# Patient Record
Sex: Male | Born: 1985 | Race: Black or African American | Hispanic: No | Marital: Single | Smoking: Current every day smoker
Health system: Southern US, Community
[De-identification: ages and names within clinical notes are randomized; demographics above are authoritative.]

## PROBLEM LIST (undated history)

## (undated) DIAGNOSIS — I10 Essential (primary) hypertension: Secondary | ICD-10-CM

---

## 2004-02-02 ENCOUNTER — Emergency Department: Payer: Self-pay | Admitting: Emergency Medicine

## 2010-11-25 ENCOUNTER — Emergency Department: Payer: Self-pay | Admitting: Emergency Medicine

## 2012-01-17 ENCOUNTER — Inpatient Hospital Stay: Payer: Self-pay | Admitting: Internal Medicine

## 2012-01-17 LAB — CBC
HCT: 41.4 % (ref 40.0–52.0)
MCH: 31.3 pg (ref 26.0–34.0)
MCV: 91 fL (ref 80–100)
Platelet: 166 10*3/uL (ref 150–440)
RBC: 4.54 10*6/uL (ref 4.40–5.90)
WBC: 7.9 10*3/uL (ref 3.8–10.6)

## 2012-01-17 LAB — COMPREHENSIVE METABOLIC PANEL
Albumin: 3.4 g/dL (ref 3.4–5.0)
Alkaline Phosphatase: 99 U/L (ref 50–136)
Anion Gap: 9 (ref 7–16)
Calcium, Total: 8.6 mg/dL (ref 8.5–10.1)
Co2: 28 mmol/L (ref 21–32)
Glucose: 102 mg/dL — ABNORMAL HIGH (ref 65–99)
Osmolality: 280 (ref 275–301)
Potassium: 3.3 mmol/L — ABNORMAL LOW (ref 3.5–5.1)
SGOT(AST): 44 U/L — ABNORMAL HIGH (ref 15–37)
SGPT (ALT): 46 U/L (ref 12–78)

## 2012-01-17 LAB — LIPID PANEL
Cholesterol: 141 mg/dL (ref 0–200)
HDL Cholesterol: 34 mg/dL — ABNORMAL LOW (ref 40–60)
Triglycerides: 108 mg/dL (ref 0–200)
VLDL Cholesterol, Calc: 22 mg/dL (ref 5–40)

## 2012-01-17 LAB — TROPONIN I
Troponin-I: 2.8 ng/mL — ABNORMAL HIGH
Troponin-I: 2.44 ng/mL — ABNORMAL HIGH

## 2012-01-17 LAB — URINALYSIS, COMPLETE
Bilirubin,UR: NEGATIVE
Blood: NEGATIVE
Nitrite: NEGATIVE
Ph: 7 (ref 4.5–8.0)
Squamous Epithelial: NONE SEEN

## 2012-01-17 LAB — CK TOTAL AND CKMB (NOT AT ARMC)
CK-MB: 1.7 ng/mL (ref 0.5–3.6)
CK-MB: 8.8 ng/mL — ABNORMAL HIGH (ref 0.5–3.6)

## 2012-01-17 LAB — PROTIME-INR
INR: 1.1
Prothrombin Time: 14.6 secs (ref 11.5–14.7)

## 2012-01-17 IMAGING — CT CT ANGIO CHEST-ABD-PELV
2 series · 16 of 42 positions shown, 19 images · IV contrast (APPLIED)
Comparison: none

REASON FOR EXAM: chest and back pain with elevated troponin eval
dissection
COMMENTS:

PROCEDURE:     CT  - CT ANGIOGRAPHY CHEST/ABD/PELVIS  - [DATE]  [DATE] [DATE]  [DATE]
RESULT:     Comparison: None
TECHNIQUE: Multiple axial images obtained from the thoracic inlet to the
pubic symphysis, without p.o. contrast and with 125 ml of [MH]
intravenous contrast, according to the dissection protocol. 3-D, MIP, and
multiplanar images were reviewed on a [employer] multiplanar work station.

[Series 6: soft tissue arm down · axial · 0.76mm/px · z∈[-503,+79]mm · 13 of 215 slices shown, 16 images]
[im 14/215  soft-tissue]
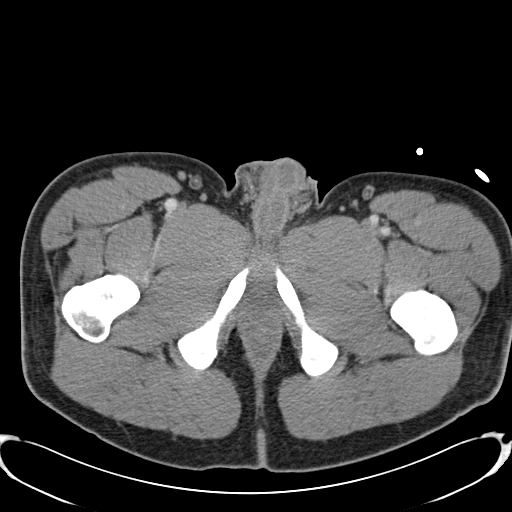
[im 14/215  bone]
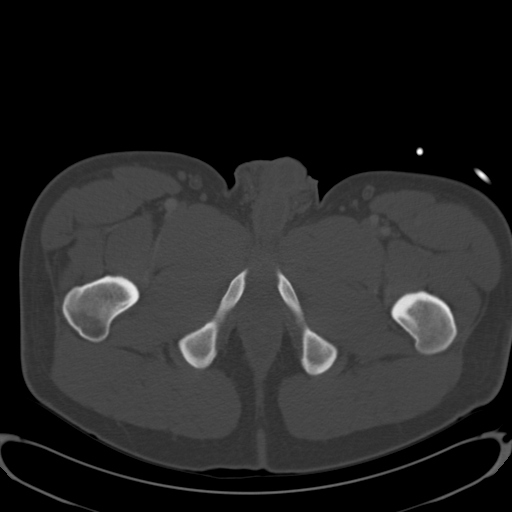
[im 35/215  soft-tissue]
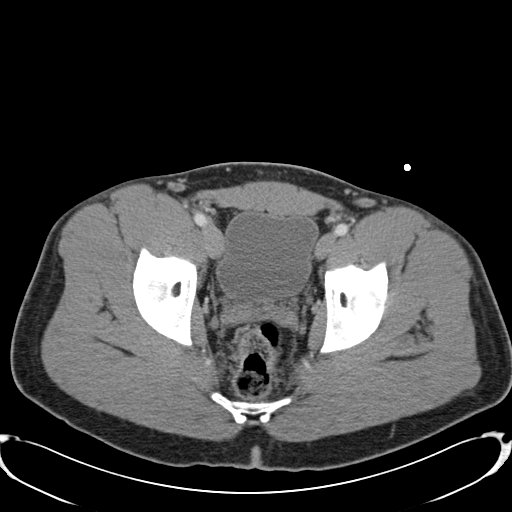
[im 56/215  soft-tissue]
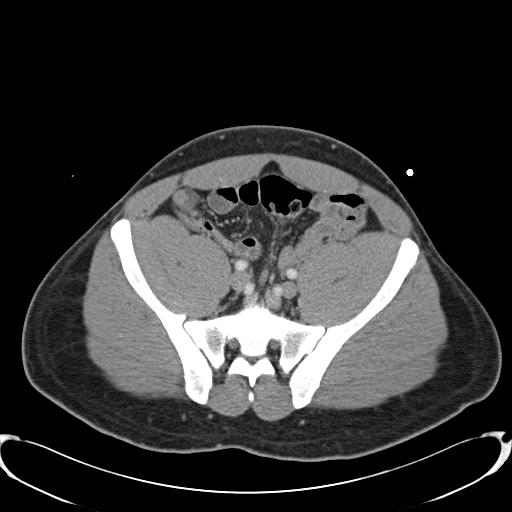
[im 76/215  soft-tissue]
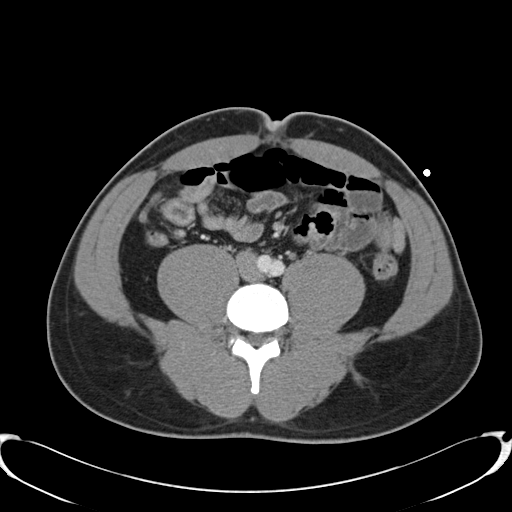
[im 97/215  soft-tissue]
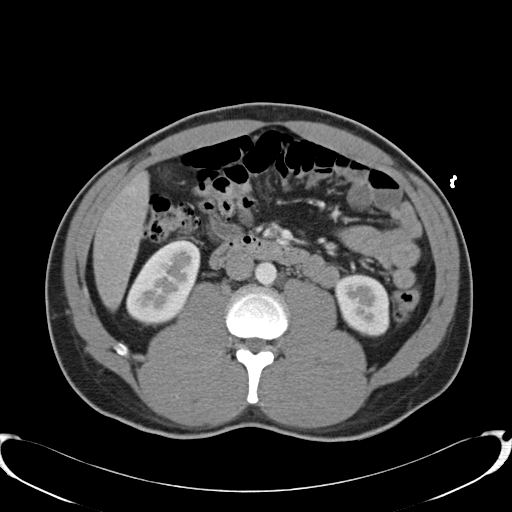
[im 118/215  soft-tissue]
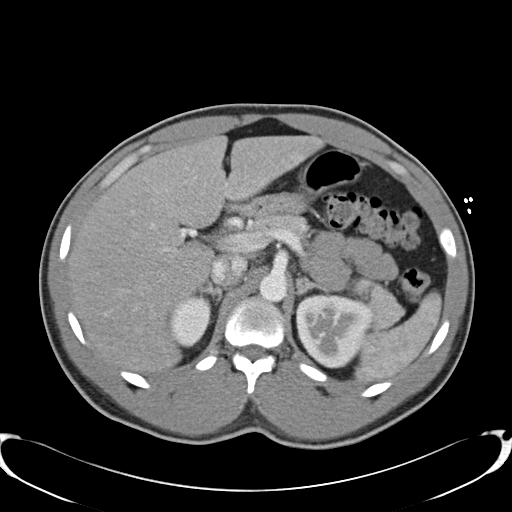
[im 139/215  soft-tissue]
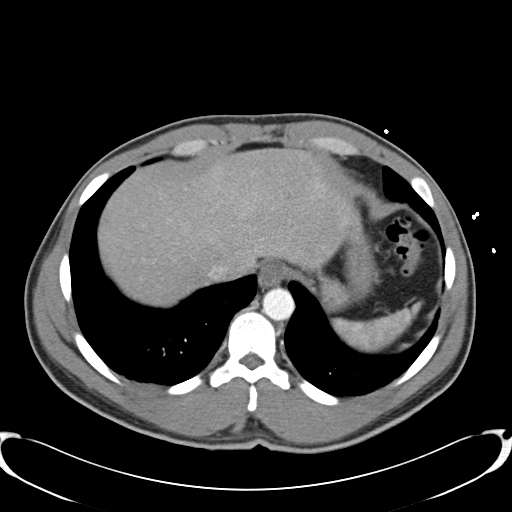
[im 159/215  soft-tissue]
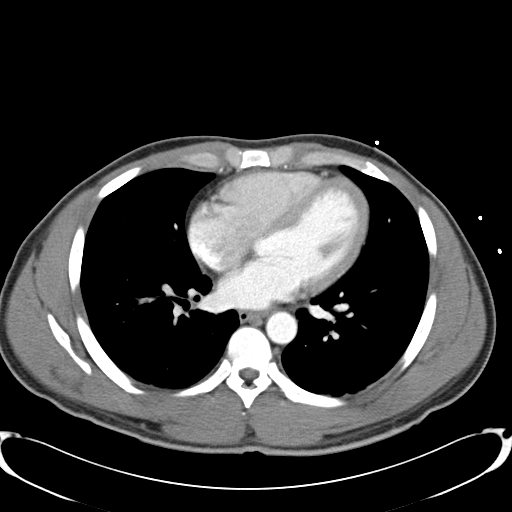
[im 180/215  soft-tissue]
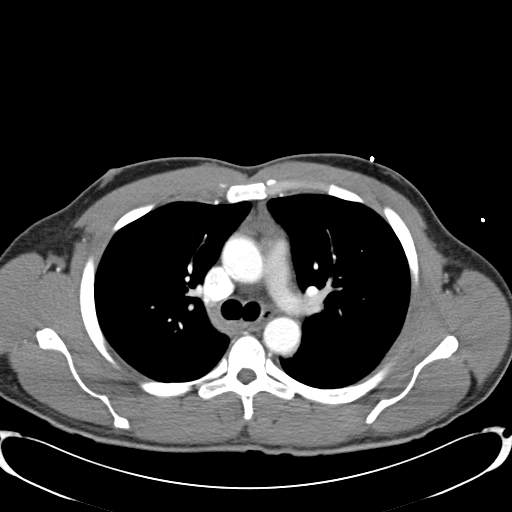
[im 180/215  bone]
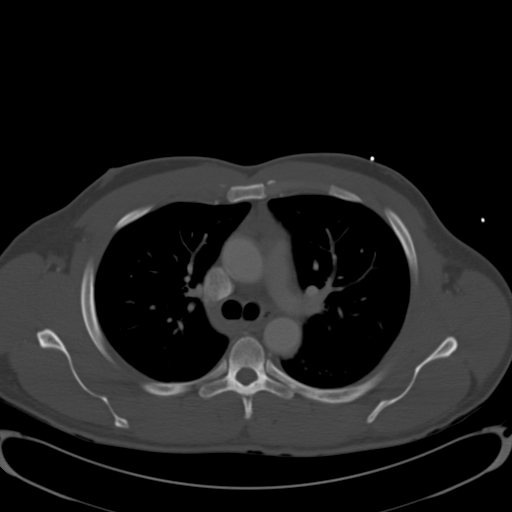
[im 187/215  lung]
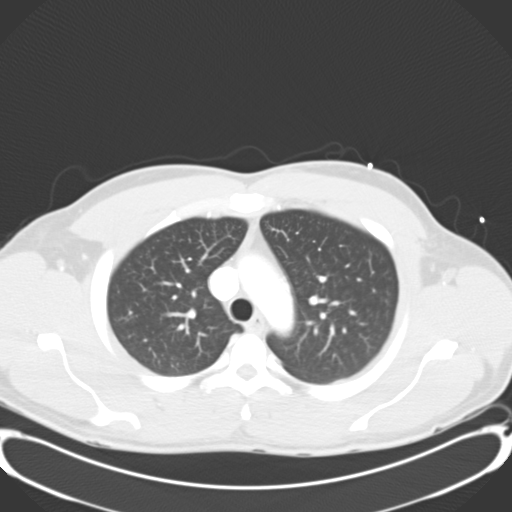
[im 194/215  lung]
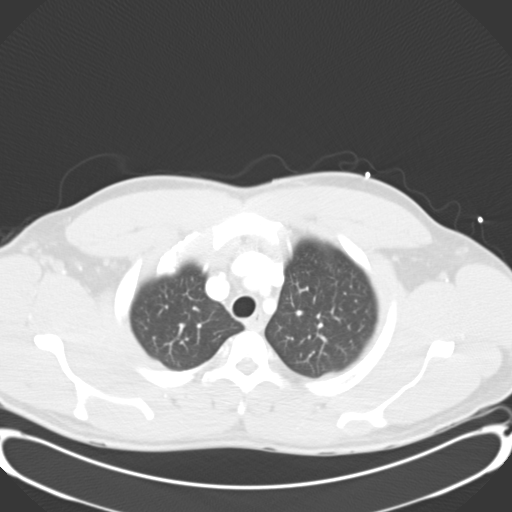
[im 201/215  soft-tissue]
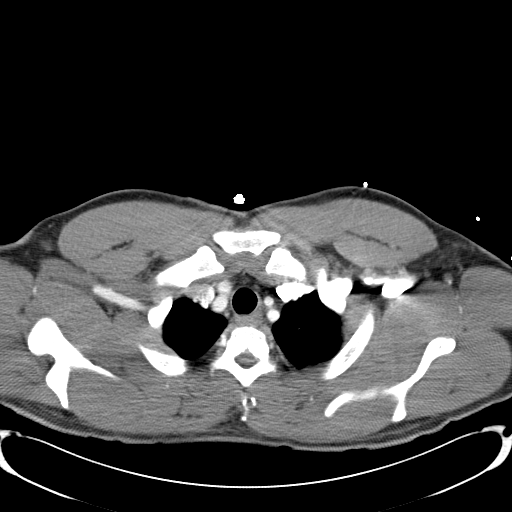
[im 201/215  lung]
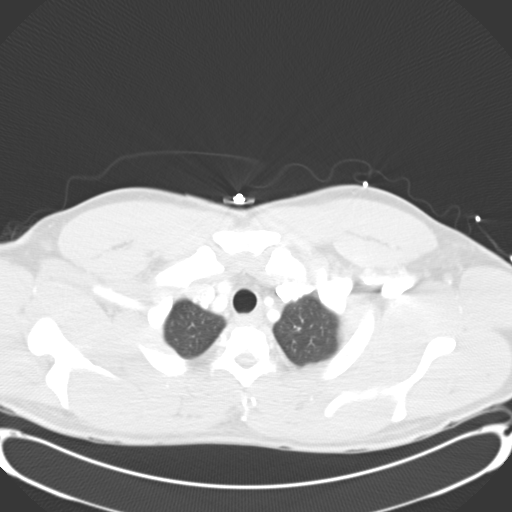
[im 208/215  lung]
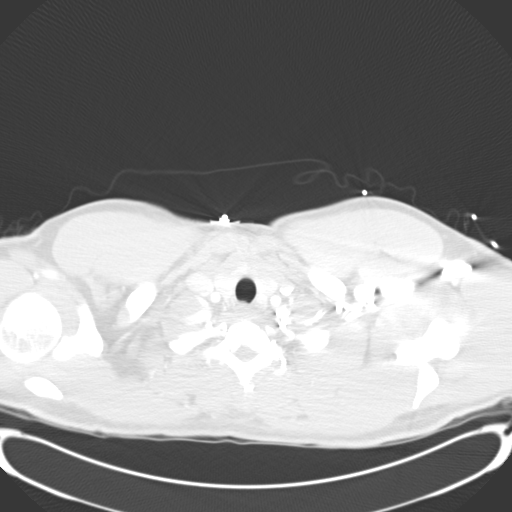

[Series 602: coronal · coronal · 1.26mm/px · 3 of 98 slices shown]
[im 33/98  soft-tissue]
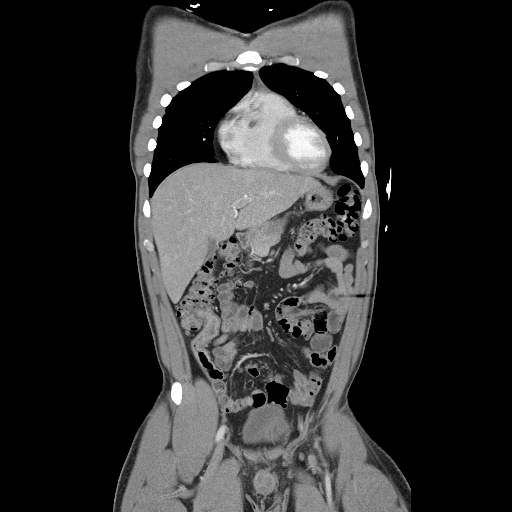
[im 44/98  soft-tissue]
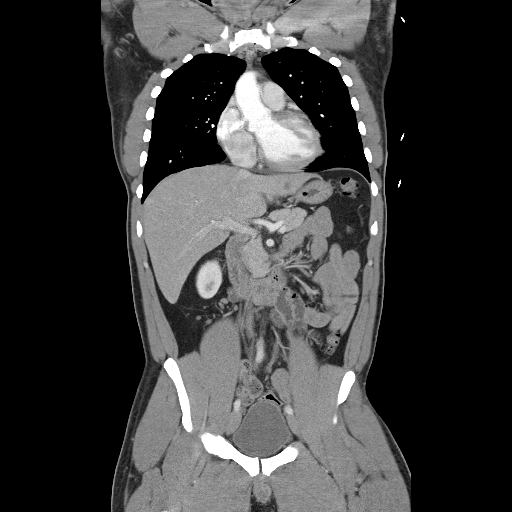
[im 54/98  soft-tissue]
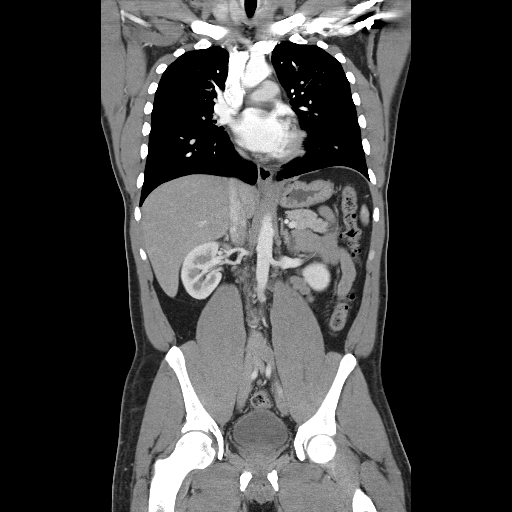

[16 of 42 positions shown; findings below may reference images not displayed]

FINDINGS: Minimal soft tissue density in the anterior mediastinum likely represents
which is several thymus. Evaluation of the aortic root and ascending aorta
is slightly limited by cardiac pulsation artifact. The aorta is normal in
caliber. No aortic dissection seen.

No mediastinal, hilar, or axillary lymphadenopathy. No pneumothorax. Minimal
basilar opacities are likely secondary to atelectasis. There is a 3 mm
nodule in the superior right lower lobe, which is likely calcified.

There are a few small, subcentimeter foci of hyperenhancement within the
liver which likely represent small perfusion anomalies. Evaluation of the
solid organs is limited by the relative arterial phase of contrast. The
spleen, adrenals, pancreas, and gallbladder are unremarkable. The kidneys
are unremarkable.

The small and large bowel are normal in caliber. There are a few diverticula
in the descending colon. The appendix is normal.

No aggressive lytic or sclerotic osseous lesions are identified.
IMPRESSION: No aortic dissection seen. Evaluation of the ascending aorta and aortic root
is slightly limited by cardiac pulsation artifact.

## 2012-01-17 IMAGING — CR DG CHEST 2V
1 series · 2 of 2 positions shown · non-contrast
Comparison: none

REASON FOR EXAM: chest pain
COMMENTS:   LMP: (Male)

[Series 1: w chest pa · 0.14mm/px · 2 of 2 slices shown]
[im 1/2]
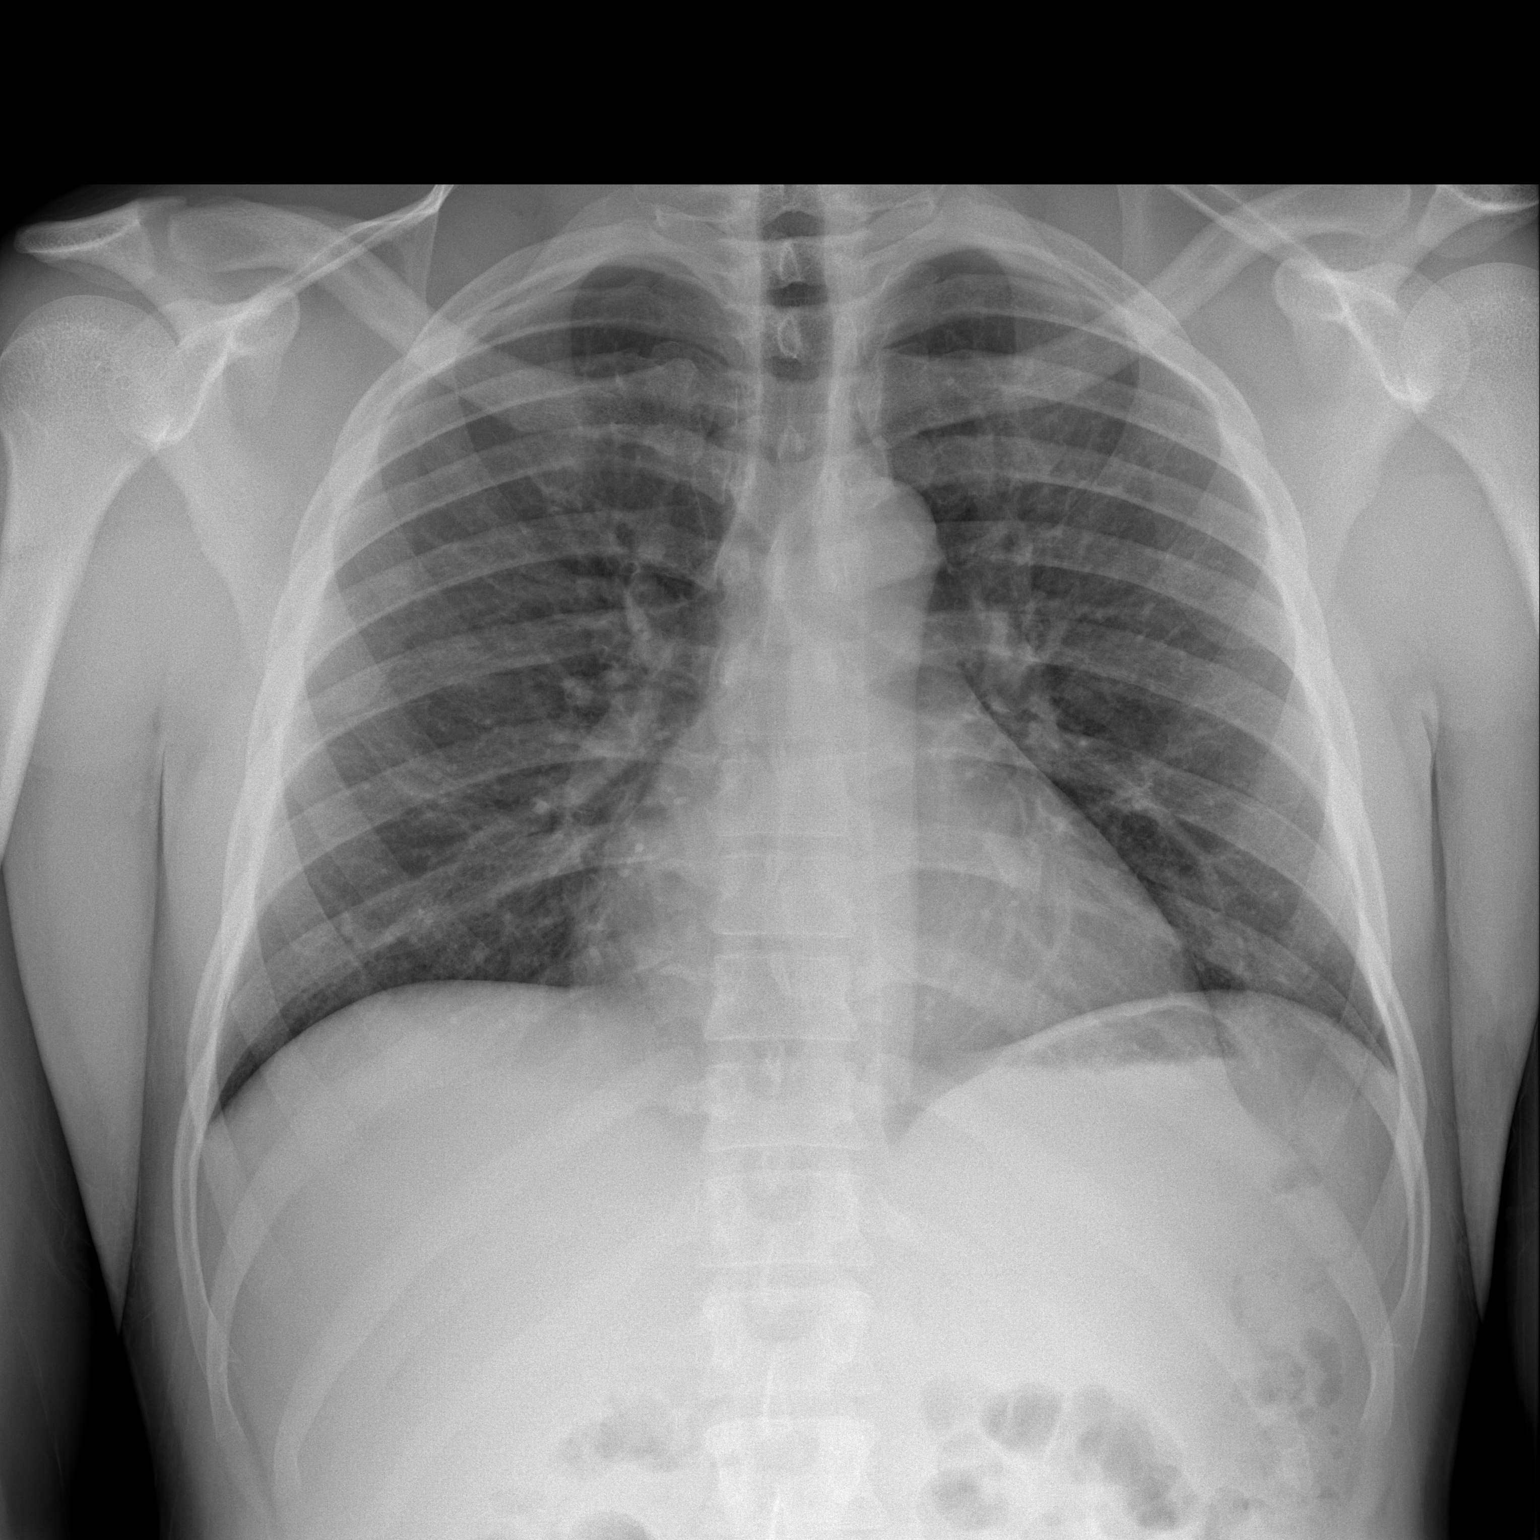
[im 2/2]
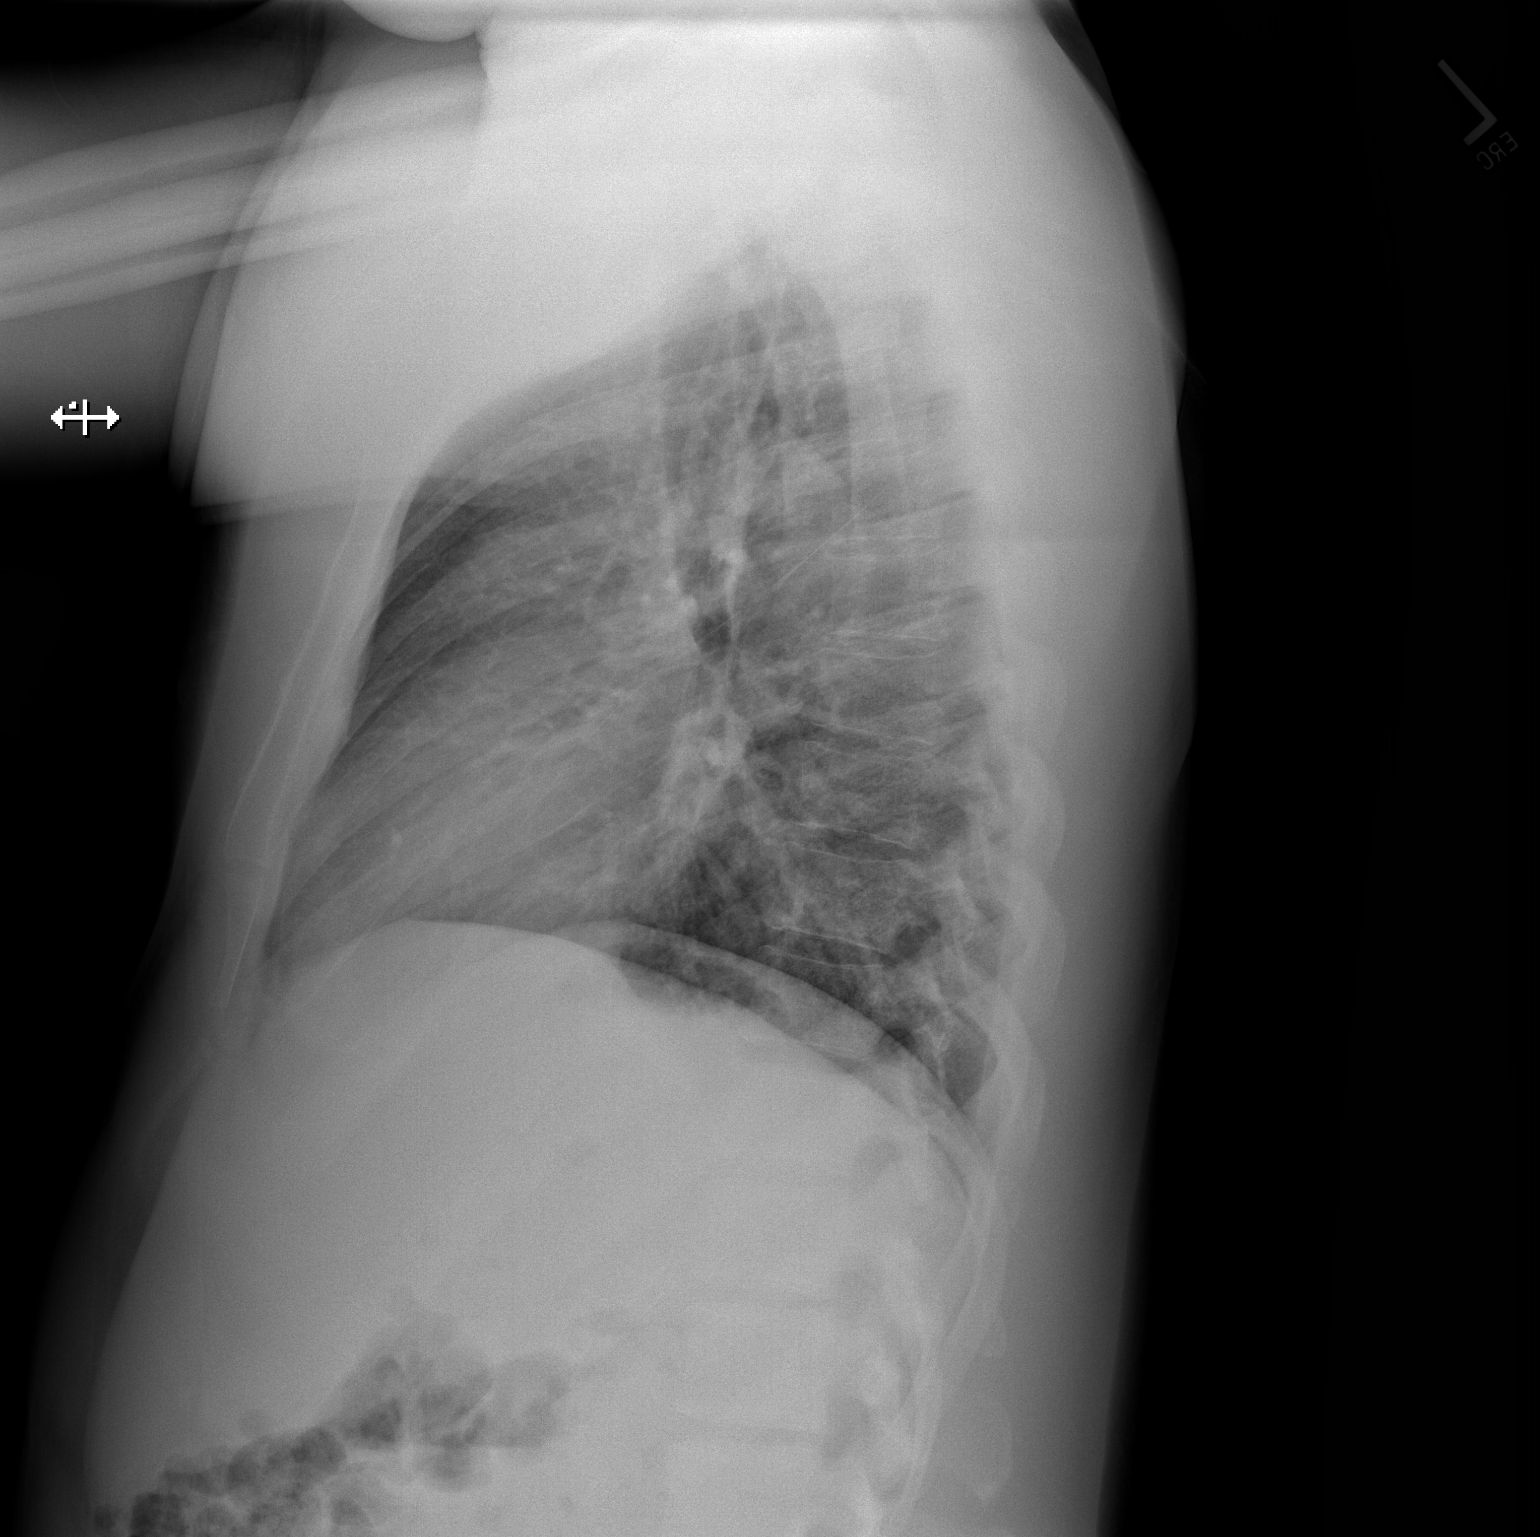

[2 of 2 positions shown; findings below may reference images not displayed]

PROCEDURE:     DXR - DXR CHEST PA (OR AP) AND LATERAL  - [DATE] [DATE]

RESULT:     The lungs are adequately inflated and exhibit mild prominence of
the interstitial markings which are nonspecific. There is no focal
infiltrate. The cardiac silhouette is enlarged. The pulmonary vascularity is
not engorged. There is no pleural effusion. The mediastinum is normal in
width. There is no evidence of a pleural effusion.
IMPRESSION: There is mild prominence of the cardiac silhouette without
evidence of pulmonary vascular congestion or pleural effusions. The
interstitial markings are also mildly prominent. Is there a smoking history?
There is no focal pneumonia, but I cannot exclude acute bronchitis with
bilateral subsegmental atelectasis in the appropriate clinical setting.

[REDACTED]

## 2012-01-18 LAB — CBC WITH DIFFERENTIAL/PLATELET
Basophil #: 0 10*3/uL (ref 0.0–0.1)
Basophil %: 0.2 %
Eosinophil #: 0.1 10*3/uL (ref 0.0–0.7)
Eosinophil %: 1.3 %
HGB: 13.8 g/dL (ref 13.0–18.0)
Lymphocyte #: 1.4 10*3/uL (ref 1.0–3.6)
MCH: 32.3 pg (ref 26.0–34.0)
MCHC: 35.2 g/dL (ref 32.0–36.0)
MCV: 92 fL (ref 80–100)
Monocyte %: 18.1 %
Neutrophil %: 61.3 %
Platelet: 172 10*3/uL (ref 150–440)
RBC: 4.26 10*6/uL — ABNORMAL LOW (ref 4.40–5.90)
WBC: 7.1 10*3/uL (ref 3.8–10.6)

## 2012-01-18 LAB — COMPREHENSIVE METABOLIC PANEL
Alkaline Phosphatase: 87 U/L (ref 50–136)
Anion Gap: 11 (ref 7–16)
BUN: 6 mg/dL — ABNORMAL LOW (ref 7–18)
Bilirubin,Total: 0.4 mg/dL (ref 0.2–1.0)
Calcium, Total: 8.4 mg/dL — ABNORMAL LOW (ref 8.5–10.1)
Chloride: 106 mmol/L (ref 98–107)
Creatinine: 0.98 mg/dL (ref 0.60–1.30)
EGFR (African American): >60
EGFR (Non-African Amer.): >60
Glucose: 83 mg/dL (ref 65–99)
Osmolality: 282 (ref 275–301)
Potassium: 3.7 mmol/L (ref 3.5–5.1)
Sodium: 143 mmol/L (ref 136–145)

## 2012-01-18 LAB — MAGNESIUM: Magnesium: 1.9 mg/dL

## 2013-10-22 ENCOUNTER — Emergency Department: Payer: Self-pay | Admitting: Emergency Medicine

## 2013-10-22 LAB — COMPREHENSIVE METABOLIC PANEL
ALBUMIN: 3.1 g/dL — AB (ref 3.4–5.0)
ALT: 22 U/L (ref 12–78)
ANION GAP: 8 (ref 7–16)
Alkaline Phosphatase: 94 U/L
BUN: 8 mg/dL (ref 7–18)
Bilirubin,Total: 0.2 mg/dL (ref 0.2–1.0)
CO2: 28 mmol/L (ref 21–32)
Calcium, Total: 8.7 mg/dL (ref 8.5–10.1)
Chloride: 104 mmol/L (ref 98–107)
Creatinine: 0.97 mg/dL (ref 0.60–1.30)
EGFR (African American): >60
Glucose: 93 mg/dL (ref 65–99)
Osmolality: 277 (ref 275–301)
Potassium: 4 mmol/L (ref 3.5–5.1)
SGOT(AST): 15 U/L (ref 15–37)
Sodium: 140 mmol/L (ref 136–145)
TOTAL PROTEIN: 7.2 g/dL (ref 6.4–8.2)

## 2013-10-22 LAB — CBC WITH DIFFERENTIAL/PLATELET
BASOS PCT: 0.3 %
Basophil #: 0 10*3/uL (ref 0.0–0.1)
Eosinophil #: 0.2 10*3/uL (ref 0.0–0.7)
Eosinophil %: 2.2 %
HCT: 38.6 % — AB (ref 40.0–52.0)
HGB: 12.6 g/dL — AB (ref 13.0–18.0)
Lymphocyte #: 1.5 10*3/uL (ref 1.0–3.6)
Lymphocyte %: 17.2 %
MCH: 29.4 pg (ref 26.0–34.0)
MCHC: 32.6 g/dL (ref 32.0–36.0)
MCV: 90 fL (ref 80–100)
MONO ABS: 1.1 x10 3/mm — AB (ref 0.2–1.0)
MONOS PCT: 12.9 %
NEUTROS PCT: 67.4 %
Neutrophil #: 5.9 10*3/uL (ref 1.4–6.5)
Platelet: 249 10*3/uL (ref 150–440)
RBC: 4.28 10*6/uL — ABNORMAL LOW (ref 4.40–5.90)
RDW: 14.2 % (ref 11.5–14.5)
WBC: 8.8 10*3/uL (ref 3.8–10.6)

## 2013-10-22 LAB — URINALYSIS, COMPLETE
BACTERIA: NONE SEEN
Bilirubin,UR: NEGATIVE
Blood: NEGATIVE
GLUCOSE, UR: NEGATIVE mg/dL (ref 0–75)
KETONE: NEGATIVE
Leukocyte Esterase: NEGATIVE
Nitrite: NEGATIVE
PROTEIN: NEGATIVE
Ph: 5 (ref 4.5–8.0)
SPECIFIC GRAVITY: 1.023 (ref 1.003–1.030)
Squamous Epithelial: <1
WBC UR: 2 /HPF (ref 0–5)

## 2013-10-22 LAB — LIPASE, BLOOD: LIPASE: 179 U/L (ref 73–393)

## 2013-10-22 IMAGING — CT CT ABD-PELV W/ CM
2 of 4 series · 16 of 46 positions shown, 18 images · IV contrast (agent unspecified)
Comparison: [DATE]

CLINICAL DATA: Intermittent abdominal pain for 1 month.
Constipation. 25 pound weight loss.

EXAM:
CT ABDOMEN AND PELVIS WITH CONTRAST
TECHNIQUE: Multidetector CT imaging of the abdomen and pelvis was performed
using the standard protocol following bolus administration of
intravenous contrast.
CONTRAST:  100 mL [N8]

[Series 2: routine abd pel with · axial · 0.72mm/px · z∈[-1058,-628]mm · 13 of 94 slices shown, 15 images]
[im 4/94  soft-tissue]
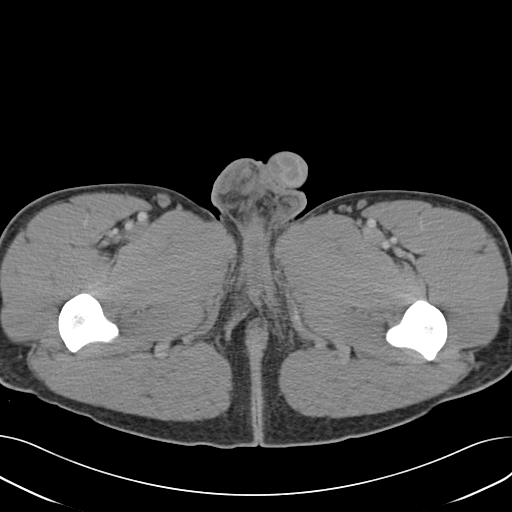
[im 4/94  bone]
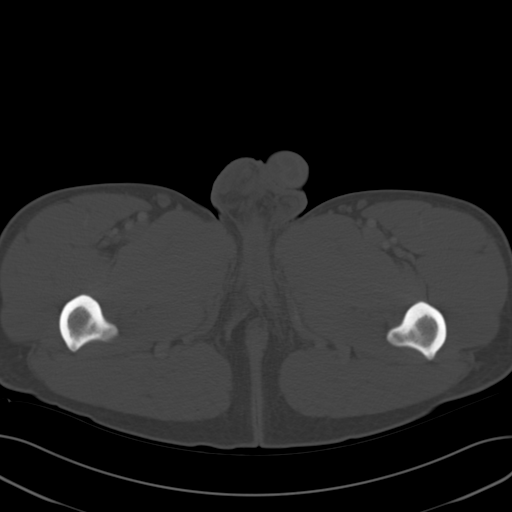
[im 12/94  soft-tissue]
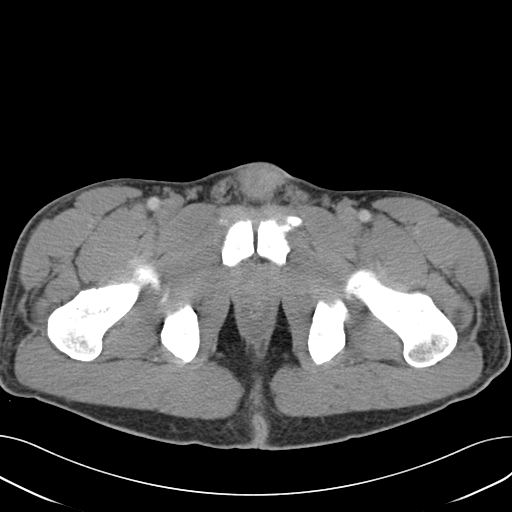
[im 20/94  soft-tissue]
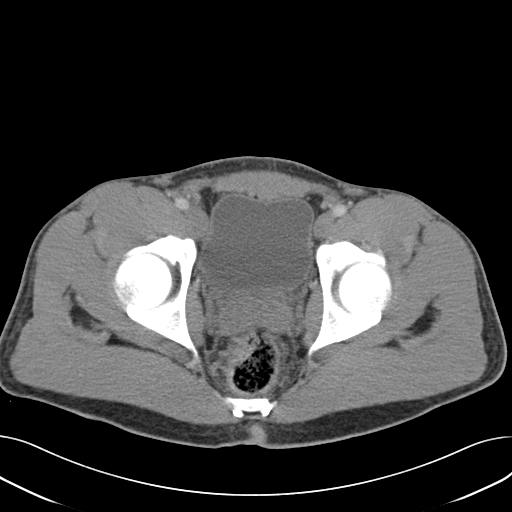
[im 28/94  soft-tissue]
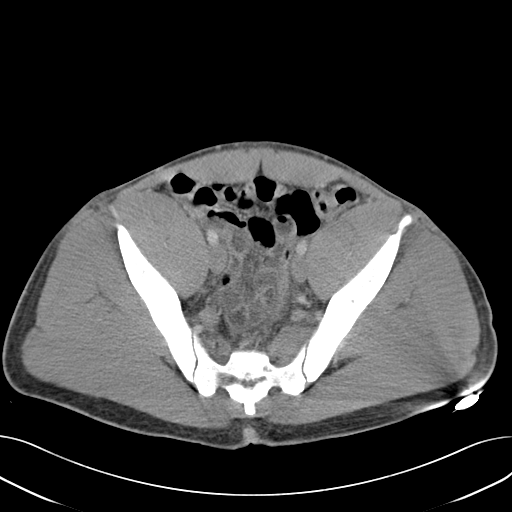
[im 32/94  soft-tissue]
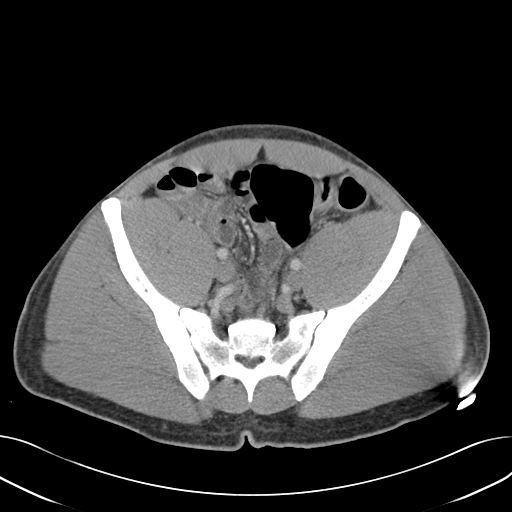
[im 39/94  soft-tissue]
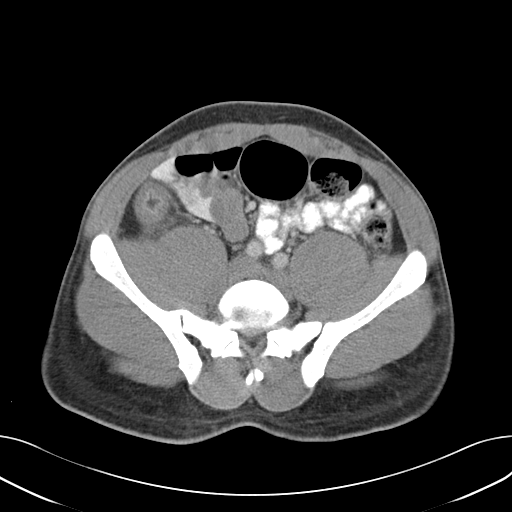
[im 47/94  soft-tissue]
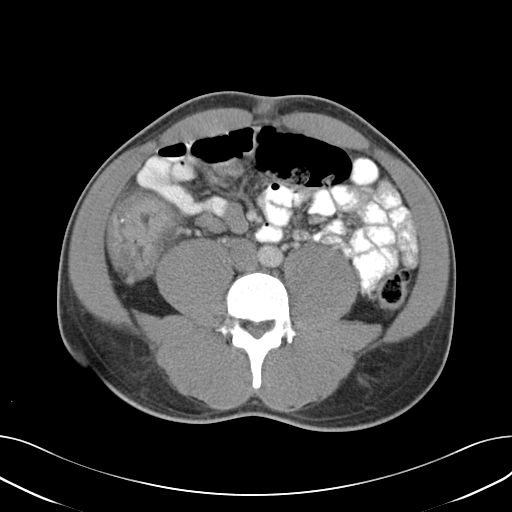
[im 55/94  soft-tissue]
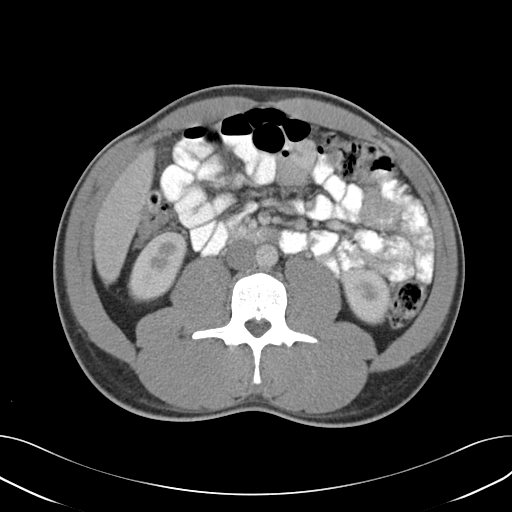
[im 63/94  soft-tissue]
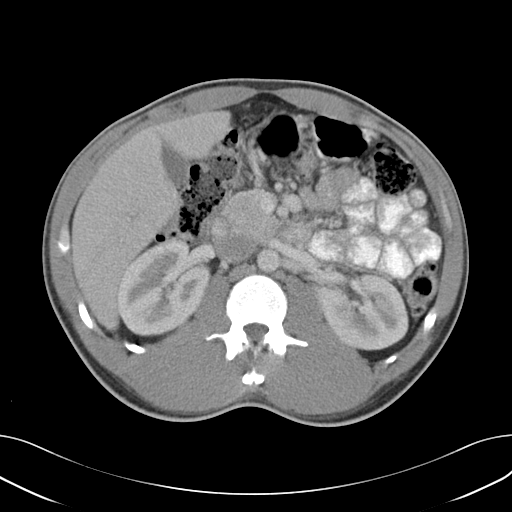
[im 63/94  bone]
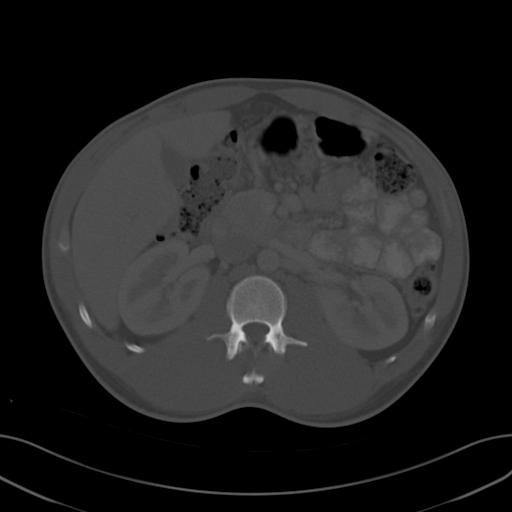
[im 66/94  soft-tissue]
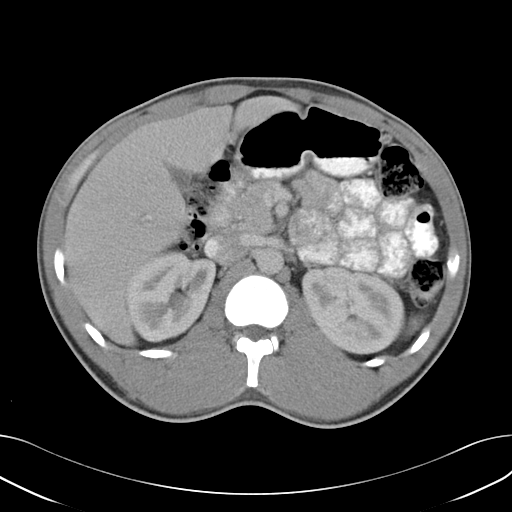
[im 74/94  soft-tissue]
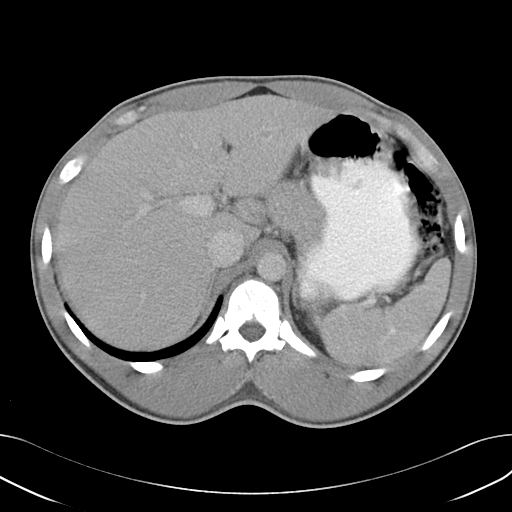
[im 82/94  soft-tissue]
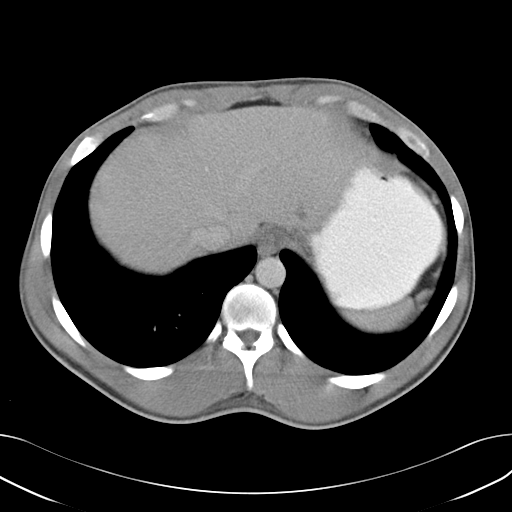
[im 90/94  soft-tissue]
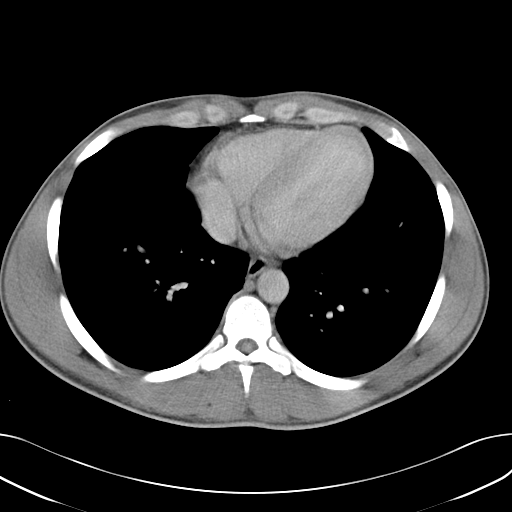

[Series 5: cor routine abd pel with · coronal · 0.69mm/px · 3 of 125 slices shown]
[im 42/125  soft-tissue]
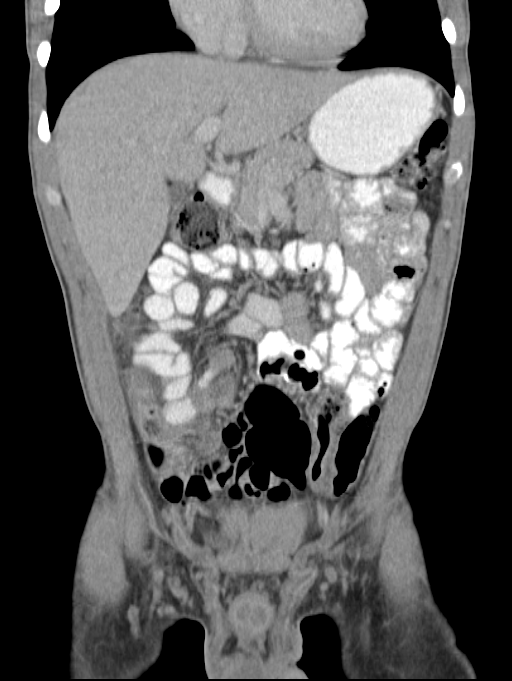
[im 56/125  soft-tissue]
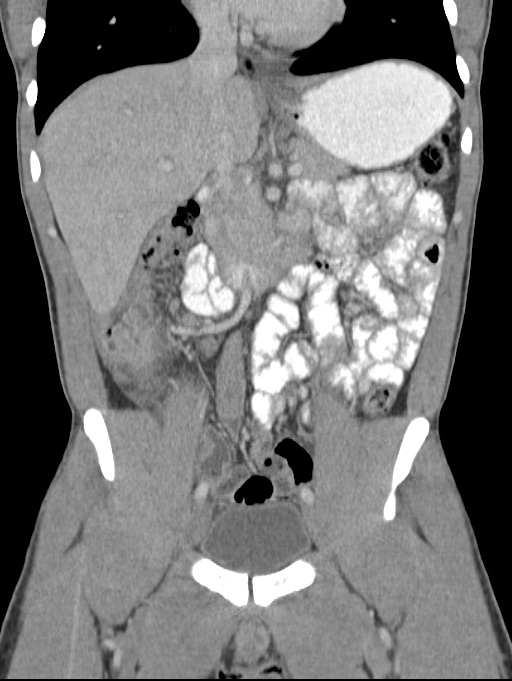
[im 69/125  soft-tissue]
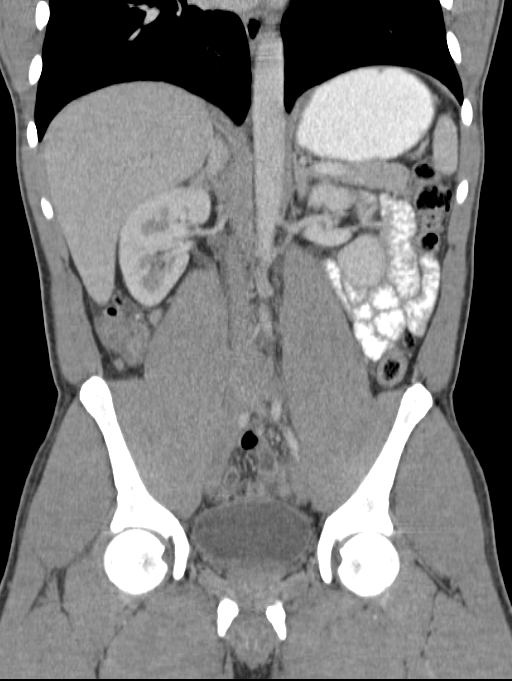

[16 of 46 positions shown; findings below may reference images not displayed]

FINDINGS: Lung bases are clear.

The liver, spleen, gallbladder, pancreas, adrenal glands, kidneys,
abdominal aorta, inferior vena cava, and retroperitoneal lymph nodes
are unremarkable. Stomach and small bowel are normal without
evidence of distention or wall thickening. There is inflammatory
wall thickening and infiltration in and around the terminal ileum
and cecum. The appendix appears normal. There are scattered lymph
nodes in the right lower quadrant, likely reactive. This appearance
suggests nonspecific infectious or inflammatory colitis. Consider
Crohn's disease.

Pelvis: Prostate gland is not enlarged. Bladder wall is not
thickened. No findings to suggest diverticulitis. No free or
loculated pelvic fluid collections. No destructive bone lesions.
IMPRESSION: Inflammatory process in around the terminal ileum and cecum with
associated right lower quadrant lymph nodes, likely reactive.
Changes suggest focal colitis. Consider Crohn disease.

## 2014-06-13 ENCOUNTER — Emergency Department: Payer: Self-pay | Admitting: Emergency Medicine

## 2014-08-05 NOTE — Discharge Summary (Signed)
PATIENT NAME:  Shawn Shawn [name], Shawn Shawn [name] MR#:  [id number] DATE OF BIRTH:  12-16-1985  DATE OF ADMISSION:  01/17/2012 DATE OF DISCHARGE:  01/18/2012  DISCHARGE DIAGNOSES:  1. Chest pain, could be viral myocarditis, improving. 2. Non-ST elevation myocardial infarction with negative catheterization, normal echocardiogram, could be due to myocarditis.   SECONDARY DIAGNOSIS: None.   CONSULTATION: Cardiology, Dr. Lady GaryFath.   PROCEDURES/RADIOLOGY: 2-D echocardiogram on 01/17/2012 showed normal LV systolic function. Mild concentric left ventricular hypertrophy. Ejection fraction more than 55%. Trace tricuspid regurgitation. Cardiac catheterization 01/17/2012 showed clean coronaries. Right coronary originating from left coronary cusp. CT scan of the chest, abdomen and pelvis with and without contrast on 01/17/2012 showed no acute pathology. No aortic dissection. Chest x-ray on 01/17/2012 showed no acute cardiopulmonary disease.   MAJOR LABORATORY PANEL: Urinalysis on admission was negative.   HISTORY AND SHORT HOSPITAL COURSE: The patient is Shawn [name] 29 year old male with no significant medical problems who was admitted for chest pain. He was ruled in for non-ST elevation myocardial infarction based on his serial cardiac biomarkers. His troponin peaked up to 2.90. Cardiology consultation was obtained with Dr. Lady GaryFath. Please see Dr. Teena IraniElgergawy's dictated history and physical for further details. The patient underwent cardiac catheterization on 10/01 which showed clean coronaries. No cardiac disease. He also had echocardiogram which was essentially within normal limits. He did have minimal chest pain, but mainly in the back and this was thought to be more muscular in nature. He was stable enough to be discharged home on 10/02.   PERTINENT PHYSICAL EXAMINATION ON THE DATE OF DISCHARGE:  VITAL SIGNS: On the date of discharge, his vital signs were as follows: Temperature 98.4, heart rate 75 per minute, respirations 20 per minute, blood  pressure 143/83. He was saturating 97% on room air. CARDIOVASCULAR: S1, S2 normal. No murmurs, rubs, or gallop. LUNGS: Clear to auscultation bilaterally. No wheezes, rales, rhonchi, or crepitation. ABDOMEN: Soft, benign. NEUROLOGIC: Nonfocal examination. All other physical examination remained at baseline.   DISCHARGE MEDICATIONS: 1. Aspirin 81 mg p.o. daily.  2. Metoprolol 25 mg half-tablet p.o. daily.  3. Pravastatin 10 mg p.o. at bedtime.   DISCHARGE DIET: Regular.   DISCHARGE ACTIVITY: As tolerated.   DISCHARGE INSTRUCTIONS AND FOLLOW-UP: The patient was instructed to [facility] 1 to 2 weeks.   TOTAL TIME DISCHARGING THIS PATIENT: 45 minutes.   ____________________________ Ellamae SiaVipul S. Sherryll BurgerShah, MD vss:ap D: 01/18/2012 14:57:52 ET T: 01/19/2012 11:25:28 ET JOB#: [id number]  cc: Artis Buechele S. Sherryll BurgerShah, MD, <Dictator> [facility] Kenneth Shawn [name]. Lady GaryFath, MD Patricia PesaVIPUL S Lachele Lievanos MD ELECTRONICALLY SIGNED 01/19/2012 11:53

## 2014-08-05 NOTE — Consult Note (Signed)
    General Aspect patient is a 29 year old male with no prior medical or cardiac  history who was admitted after devain that occurred while sleeping He denies any prior exertional chest discomfort. He describe no significant cges on his electrordiogram. He has ruled in for a non-ST elevation myocardial iinfarction with a troponin of 2.9. His pain has waxed and waned since admission and it does not appear to havea positional  component. Risk factors include tobacco abuse. He denies family history, He is currently stable with no chest pain   Physical Exam:   GEN well developed, well nourished, no acute distress    HEENT PERRL, hearing intact to voice    NECK supple    RESP normal resp effort  clear BS  no use of accessory muscles    CARD Regular rate and rhythm  Normal, S1, S2    ABD denies tenderness  normal BS  no Abdominal Bruits  no Adominal Mass    LYMPH negative neck    EXTR negative cyanosis/clubbing, negative edema    SKIN normal to palpation    NEURO cranial nerves intact, motor/sensory function intact    PSYCH A+O to time, place, person   Review of Systems:   Subjective/Chief Complaint Chest pain    General: No Complaints    Skin: No Complaints    ENT: No Complaints    Eyes: No Complaints    Neck: No Complaints    Respiratory: No Complaints    Cardiovascular: Chest pain or discomfort    Gastrointestinal: No Complaints    Genitourinary: No Complaints    Vascular: No Complaints    Musculoskeletal: No Complaints    Neurologic: No Complaints    Hematologic: No Complaints    Endocrine: No Complaints    Psychiatric: No Complaints    Review of Systems: All other systems were reviewed and found to be negative    Medications/Allergies Reviewed Medications/Allergies reviewed     Denies medical history:    Denies surgical history.:   EKG:   EKG NSR    Abnormal NSSTTW changes    No Known Allergies:     Impression 29 yo male with no prior  cardiac history who was admitted iwth chest pain radiating to his back. EKG revealed nonspecific st t wave changes. He has an elevated serum troponin to 2.9. Etiology is unclear. NSTEMI vs myocarditis. EKG and symptoms are not suggestive of pericarditis. DEnies elicit drug use.    Plan 1. Continue current meds 2. Echo to evaluate wall motion and pericardium 3. Cardiac cath to evalaute anatomy in patient with elevated troponin and rest chest pain with radiation to back 4. Risk and benefits of cath explained. 5. Further recs after cath.   Electronic Signatures: Dalia HeadingFath, Kaedin Hicklin A (MD)  (Signed 01-Oct-13 09:09)  Authored: General Aspect/Present Illness, History and Physical Exam, Review of System, Past Medical History, EKG , Allergies, Impression/Plan   Last Updated: 01-Oct-13 09:09 by Dalia HeadingFath, Hendrixx Severin A (MD)

## 2014-08-05 NOTE — H&P (Signed)
PATIENT NAME:  Shawn [name], Shawn [name] MR#:  [id number] DATE OF BIRTH:  1986/01/21  DATE OF ADMISSION:  01/17/2012  REFERRING PHYSICIAN: Dr. Lucrezia Europe   PRIMARY CARE PHYSICIAN: None  CHIEF COMPLAINT: Chest pain.   HISTORY OF PRESENT ILLNESS: This is a 29 year old male without significant past medical history presents with complaint of chest pain. Patient reports chest pain woke him up from sleep this evening. Reports chest pain radiating to the back, as well has been complaining of mild nausea, diaphoresis. Denies any vomiting, palpitations. Reports some mild shortness of breath as well. Upon presentation to ED patient was found to be hypertensive with blood pressure 160/101, with low-grade temperature 100.2. Patient had EKG done which did not show any significant ST or T wave abnormality. Patient's first set of troponin came back positive of 0.34. Given the fact patient had chest pain radiating to the back he had CT angiogram of chest, abdomen, and pelvis which came back negative for PE. As well it did show normal thoracic and abdominal aorta. Patient continues to have some mild chest pain in the ED so he was started on nitro paste, patient was given 324 of aspirin by EMS. Patient denies any significant history of heart disease in the family. Reports he has not been following with any physician, not taking any home medication. Reports he quit smoking before one week.   PAST MEDICAL HISTORY: None.   PAST SURGICAL HISTORY: None.   HOME MEDICATIONS: None.   ALLERGIES: No known drug allergies.   FAMILY HISTORY: Denies any family history of coronary artery disease or diabetes.   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 Quit smoking last week. Reports occasional alcohol use; last time he had a drink on Saturday, reports he drinks 1 to 2 only a month. Denies any history of drug abuse or illicit drug use.   REVIEW OF SYSTEMS: CONSTITUTIONAL: Patient denies any fever,  fatigue, weakness. EYES: Denies blurry vision, double vision or pain. ENT: Denies tinnitus, ear pain, hearing loss. RESPIRATORY: Denies any cough, wheezing, hemoptysis. Has mild dyspnea. CARDIOVASCULAR: Complains of chest pain. Denies any orthopnea, edema, arrhythmia, palpitations, syncope. GASTROINTESTINAL: Complains of nausea. Denies vomiting, diarrhea, abdominal pain, hematemesis. GENITOURINARY: Denies dysuria, hematuria, renal colic. ENDO: Denies polyuria, polydipsia, heat or cold intolerance. INTEGUMENTARY: Denies acne, rash or lesions. MUSCULOSKELETAL: Denies any neck pain. Has complaints of back pain. Denies any arthritis, cramps, swelling or gout. NEUROLOGICAL: Denies numbness, weakness, dysarthria, epilepsy, tremors, vertigo. PSYCHIATRIC: Denies anxiety, insomnia, schizophrenia, nervousness. Denies substance abuse. Denies alcohol abuse.   PHYSICAL EXAMINATION:  VITAL SIGNS: Temperature 99.8, T-max 100.2, pulse 72, respiratory rate 20, blood pressure 171/109, pulse oximetry 100% on room air.   GENERAL: Young male, looks comfortable in bed in no apparent distress.   HEENT: Head atraumatic, normocephalic. Pupils equal, reactive to light. Pink conjunctivae. Anicteric sclera. Moist oral mucosa.   NECK: Supple. No thyromegaly. No JVD.   CHEST: Good air entry bilaterally. No wheezing, rales, rhonchi.   CARDIOVASCULAR: S1, S2 heard. No rubs, murmurs, gallops.   ABDOMEN: Soft, nontender, nondistended. Bowel sounds present.   EXTREMITIES: No edema. No clubbing. No cyanosis.   PSYCHIATRIC: Appropriate affect. Awake, alert x3. Intact judgment and insight.   NEUROLOGIC: Cranial nerves grossly intact. Motor 5/5 in all extremities. Sensation intact and symmetrical.   SKIN: No rash. Normal skin turgor. Moist and dry. Has multiple skin tattoos.   LABORATORY, DIAGNOSTIC AND RADIOLOGICAL DATA: Glucose 102, BUN 13, creatinine 1.14, sodium 140, potassium  3.3, chloride 103, CO2 28, total protein 7.1,  albumin 3.4, total CK 443, CPK-MB 1.7, troponin 0.34, white blood cells 7.9, hemoglobin 14.2, hematocrit 41.4, platelets 1636. EKG showing normal sinus rhythm at 82, no significant ST or T wave changes.   ASSESSMENT AND PLAN:  1. Chest pain with elevated troponin. Patient had CT chest, abdomen and pelvis with IV contrast which came back negative for any PE or dissecting aortic aneurysm. Given the fact patient has elevated troponin this raises suspicion for acute coronary syndrome even though without much significant risk factors. Patient was given aspirin. Will be started on heparin drip for anticoagulation and will be started on beta blocker and statin. Will cycle troponins, follow the trend and will consult cardiology service as well. Given the fact patient still having mild complains of chest pain and elevated blood pressure will start him on nitro paste as well.  2. Low grade fever, etiology is unclear. Will check urinalysis. Patient has no evidence of pneumonia or infectious process on his CT chest, abdomen, and pelvis. There is a probability of pericarditis given the fact patient had low-grade temperature and chest pain with mildly elevated troponin but this will be evaluated by echo.  3. Deep vein thrombosis prophylaxis. Patient is on heparin drip.  4. CODE STATUS: FULL CODE.   TOTAL TIME SPENT ON ADMISSION AND PATIENT CARE: 45 minutes.   ____________________________ Starleen Armsawood S. Elgergawy, MD dse:cms D: 01/17/2012 05:55:58 ET T: 01/17/2012 08:43:05 ET JOB#: [id number]  cc: Starleen Armsawood S. Elgergawy, MD, <Dictator> DAWOOD Teena IraniS ELGERGAWY MD ELECTRONICALLY SIGNED 01/18/2012 1:45

## 2016-01-03 ENCOUNTER — Emergency Department
Admission: EM | Admit: 2016-01-03 | Discharge: 2016-01-03 | Disposition: A | Discharge status: Home / Self Care | Payer: Self-pay | Attending: Emergency Medicine | Admitting: Emergency Medicine

## 2016-01-03 ENCOUNTER — Encounter: Payer: Self-pay | Admitting: Emergency Medicine

## 2016-01-03 DIAGNOSIS — L0201 Cutaneous abscess of face: Secondary | ICD-10-CM | POA: Insufficient documentation

## 2016-01-03 DIAGNOSIS — F17210 Nicotine dependence, cigarettes, uncomplicated: Secondary | ICD-10-CM | POA: Insufficient documentation

## 2016-01-03 DIAGNOSIS — K047 Periapical abscess without sinus: Secondary | ICD-10-CM | POA: Insufficient documentation

## 2016-01-03 MED ORDER — IBUPROFEN 800 MG PO TABS
800.0000 mg | ORAL_TABLET | Freq: Once | ORAL | Status: AC
  Administered 2016-01-03: 800 mg via ORAL
  Filled 2016-01-03: qty 1

## 2016-01-03 MED ORDER — PENICILLIN V POTASSIUM 500 MG PO TABS: 500.0000 mg | ORAL_TABLET | Freq: Four times a day (QID) | ORAL | 0 refills | Status: AC

## 2016-01-03 MED ORDER — IBUPROFEN 800 MG PO TABS: 800.0000 mg | ORAL_TABLET | Freq: Three times a day (TID) | ORAL | 0 refills | Status: DC | PRN

## 2016-01-03 MED ORDER — HYDROCODONE-ACETAMINOPHEN 5-325 MG PO TABS: 1.0000 | ORAL_TABLET | Freq: Four times a day (QID) | ORAL | 0 refills | Status: DC | PRN

## 2016-01-03 NOTE — ED Triage Notes (Signed)
Pt to ED from home c/o lower jaw swelling since approximately last Wednesday.  Pt states swelling has gotten worse, painful to swallow, denies SOB or n/v/d.  States chipped tooth to back lower left.  Pt presents with swelling and firmness to left lower jaw, A&Ox4, chest rise even and unlabored.

## 2016-01-03 NOTE — ED Provider Notes (Signed)
Time Seen: Approximately 0405  I have reviewed the triage notes  Chief Complaint: Facial Swelling   History of Present Illness: Shawn [name] is a 30 y.o. male who states some left lower dental discomfort. He states he broke off a tooth and started developing some jaw swelling and pain that started last Wednesday. He denies any fever at home. Pain is over the area of the left lower molar.   History reviewed. No pertinent past medical history.  There are no active problems to display for this patient.   History reviewed. No pertinent surgical history.  History reviewed. No pertinent surgical history.  Current Outpatient Rx  . Order #: [id number] Class: Print  . Order #: [id number] Class: Print  . Order #: [id number] Class: Print    Allergies:  Review of patient's allergies indicates no known allergies.  Family History: History reviewed. No pertinent family history.  Social History: Social History  Substance Use Topics  . Smoking status: Current Every Day Smoker    Packs/day: 0.50    Types: Cigarettes  . Smokeless tobacco: Never Used  . Alcohol use Yes     Comment: once a month     Review of Systems:   10 point review of systems was performed and was otherwise negative:  Constitutional: No fever Eyes: No visual disturbances ENT: No sore throat, ear pain Cardiac: No chest pain Respiratory: No shortness of breath, wheezing, or stridor Abdomen: No abdominal pain, no vomiting, No diarrhea Endocrine: No weight loss, No night sweats Extremities: No peripheral edema, cyanosis Skin: No rashes, easy bruising Neurologic: No focal weakness, trouble with speech Patient does describe some discomfort with swallowing Urologic: No dysuria, Hematuria, or urinary frequency   Physical Exam:  ED Triage Vitals  Enc Vitals Group     BP 01/03/16 0243 (!) 160/110     Pulse Rate 01/03/16 0243 95     Resp 01/03/16 0243 18     Temp 01/03/16 0243 98.6 F (37 C)     Temp Source  01/03/16 0243 Oral     SpO2 01/03/16 0243 97 %     Weight 01/03/16 0244 190 lb (86.2 kg)     Height 01/03/16 0244 5\' 10"  (1.778 m)     Head Circumference --      Peak Flow --      Pain Score 01/03/16 0244 9     Pain Loc --      Pain Edu? --      Excl. in GC? --     General: Awake , Alert , and Oriented times 3; GCS 15 Head: Normal cephalic , atraumatic Eyes: Pupils equal , round, reactive to light Nose/Throat: No nasal drainage, patent upper airway without erythema or exudate. Patient has tenderness over the left lower molar with some swelling. No erythema or drainage is noted Neck: Supple, Full range of motion, No anterior adenopathy or palpable thyroid masses Lungs: Clear to ascultation without wheezes , rhonchi, or rales Heart: Regular rate, regular rhythm without murmurs , gallops , or rubs  Labs:   All laboratory work was reviewed including any pertinent negatives or positives listed below:  Labs Reviewed - No data to display   ED Course:  Patient's stay here was uneventful Clinical Course     Assessment:  Dental abscess   Final Clinical Impression:   Final diagnoses:  Dental abscess  Abscess of cheek     Plan:  Outpatient management " Discharge Medication List as of 01/03/2016  4:36 AM  START taking these medications   Details  HYDROcodone-acetaminophen (NORCO) 5-325 MG tablet Take 1 tablet by mouth every 6 (six) hours as needed for moderate pain., Starting Sun 01/03/2016, Print    ibuprofen (ADVIL,MOTRIN) 800 MG tablet Take 1 tablet (800 mg total) by mouth every 8 (eight) hours as needed., Starting Sun 01/03/2016, Print    penicillin v potassium (VEETID) 500 MG tablet Take 1 tablet (500 mg total) by mouth 4 (four) times daily., Starting Sun 01/03/2016, Until Wed 01/13/2016, Print      "  Patient was advised to return immediately if condition worsens. Patient was advised to follow up with their primary care physician or other specialized physicians  involved in their outpatient care. The patient and/or family member/power of attorney had laboratory results reviewed at the bedside. All questions and concerns were addressed and appropriate discharge instructions were distributed by the nursing staff.            Jennye MoccasinBrian S Kerisha Goughnour, MD 01/03/16 [phone]

## 2016-01-03 NOTE — Discharge Instructions (Addendum)
Please return immediately if condition worsens. Please contact her primary physician or the physician you were given for referral. If you have any specialist physicians involved in her treatment and plan please also contact them. Thank you for using [facility]. ° °

## 2016-01-04 ENCOUNTER — Emergency Department: Payer: Self-pay

## 2016-01-04 ENCOUNTER — Emergency Department
Admission: EM | Admit: 2016-01-04 | Discharge: 2016-01-04 | Disposition: A | Discharge status: Home / Self Care | Payer: Self-pay | Attending: Emergency Medicine | Admitting: Emergency Medicine

## 2016-01-04 ENCOUNTER — Encounter: Payer: Self-pay | Admitting: Medical Oncology

## 2016-01-04 DIAGNOSIS — Z791 Long term (current) use of non-steroidal anti-inflammatories (NSAID): Secondary | ICD-10-CM | POA: Insufficient documentation

## 2016-01-04 DIAGNOSIS — F17210 Nicotine dependence, cigarettes, uncomplicated: Secondary | ICD-10-CM | POA: Insufficient documentation

## 2016-01-04 DIAGNOSIS — K047 Periapical abscess without sinus: Secondary | ICD-10-CM

## 2016-01-04 DIAGNOSIS — K046 Periapical abscess with sinus: Secondary | ICD-10-CM | POA: Insufficient documentation

## 2016-01-04 LAB — CBC WITH DIFFERENTIAL/PLATELET
BASOS ABS: 0 10*3/uL (ref 0–0.1)
BASOS PCT: 0 %
EOS ABS: 0.1 10*3/uL (ref 0–0.7)
Eosinophils Relative: 1 %
HEMATOCRIT: 40.8 % (ref 40.0–52.0)
HEMOGLOBIN: 14.3 g/dL (ref 13.0–18.0)
LYMPHS ABS: 0.8 10*3/uL — AB (ref 1.0–3.6)
Lymphocytes Relative: 6 %
MCH: 31.6 pg (ref 26.0–34.0)
MCHC: 35 g/dL (ref 32.0–36.0)
MCV: 90.3 fL (ref 80.0–100.0)
MONO ABS: 1.4 10*3/uL — AB (ref 0.2–1.0)
Monocytes Relative: 12 %
Neutro Abs: 9.8 10*3/uL — ABNORMAL HIGH (ref 1.4–6.5)
Neutrophils Relative %: 81 %
Platelets: 199 10*3/uL (ref 150–440)
RBC: 4.52 MIL/uL (ref 4.40–5.90)
RDW: 13.6 % (ref 11.5–14.5)
WBC: 12.1 10*3/uL — AB (ref 3.8–10.6)

## 2016-01-04 LAB — COMPREHENSIVE METABOLIC PANEL
ALT: 21 U/L (ref 17–63)
AST: 32 U/L (ref 15–41)
Albumin: 4.2 g/dL (ref 3.5–5.0)
Alkaline Phosphatase: 102 U/L (ref 38–126)
Anion gap: 9 (ref 5–15)
BUN: 15 mg/dL (ref 6–20)
CHLORIDE: 102 mmol/L (ref 101–111)
CO2: 26 mmol/L (ref 22–32)
CREATININE: 0.9 mg/dL (ref 0.61–1.24)
Calcium: 9.2 mg/dL (ref 8.9–10.3)
GFR calc Af Amer: >60 mL/min (ref >60)
Glucose, Bld: 90 mg/dL (ref 65–99)
Potassium: 4 mmol/L (ref 3.5–5.1)
Sodium: 137 mmol/L (ref 135–145)
Total Bilirubin: 0.6 mg/dL (ref 0.3–1.2)
Total Protein: 8.2 g/dL — ABNORMAL HIGH (ref 6.5–8.1)

## 2016-01-04 IMAGING — CT CT NECK W/ CM
4 of 5 series · 13 of 33 positions shown, 15 images · IV contrast (iopamidol)
Comparison: Chest CT [DATE].

CLINICAL DATA: 30-year-old male with left facial swelling thought
related to bad tooth for 5 days. Associated sore throat now. Initial
encounter.

EXAM:
CT NECK WITH CONTRAST
TECHNIQUE: Multidetector CT imaging of the neck was performed using the
standard protocol following the bolus administration of intravenous
contrast.
CONTRAST:  75mL [I4] IOPAMIDOL ([I4]) INJECTION 61%

[Series 2: axial neck · axial · 0.61mm/px · z∈[-268,-106]mm · 4 of 137 slices shown, 5 images]
[im 28/137  soft-tissue]
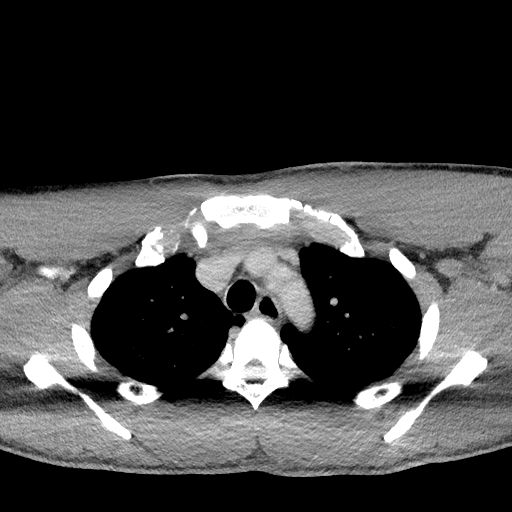
[im 28/137  bone]
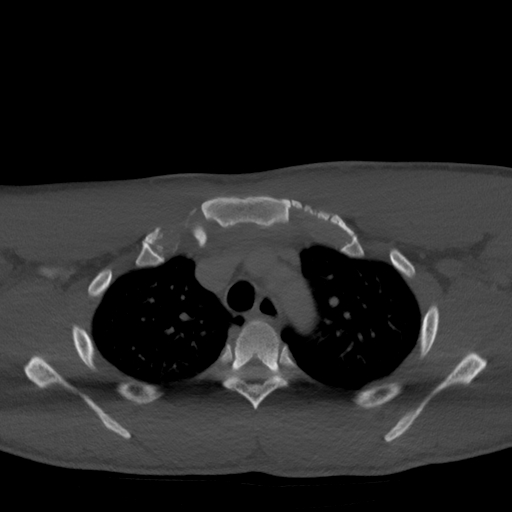
[im 55/137  bone]
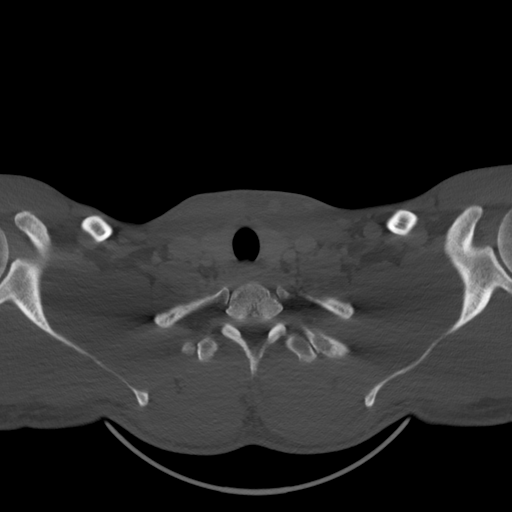
[im 82/137  bone]
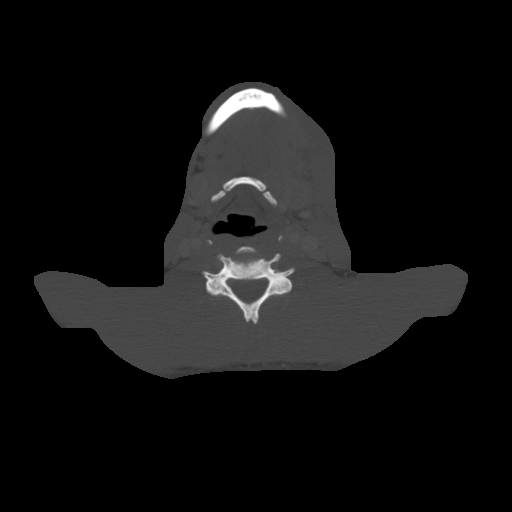
[im 109/137  bone]
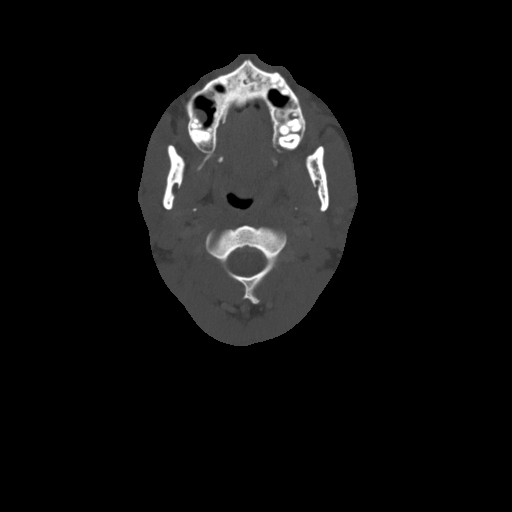

[Series 6: orthogonal ax · axial · 0.47mm/px · 1 of 160 slices shown]
[im 32/160  bone]
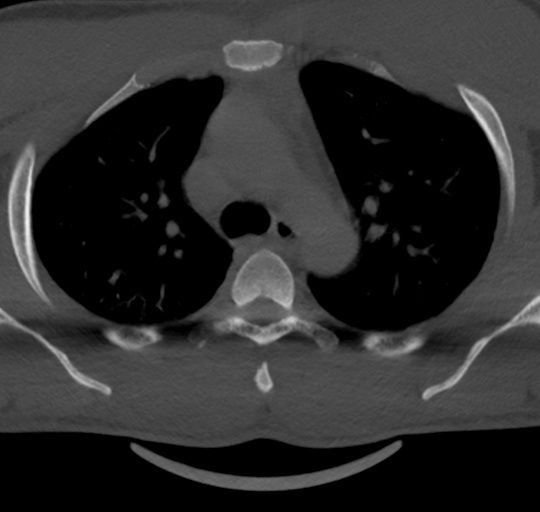

[Series 7: cor neck · coronal · 0.59mm/px · 3 of 106 slices shown]
[im 32/106  bone]
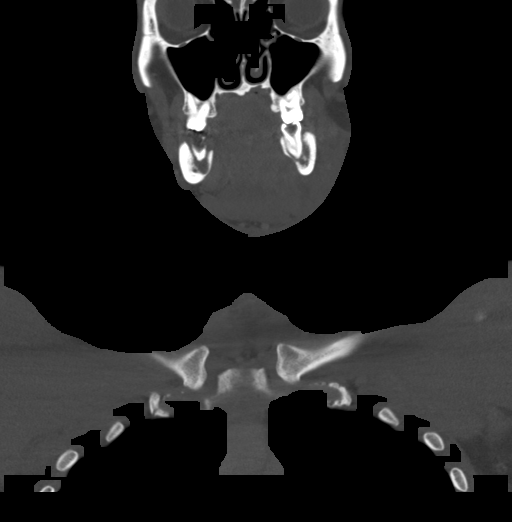
[im 46/106  bone]
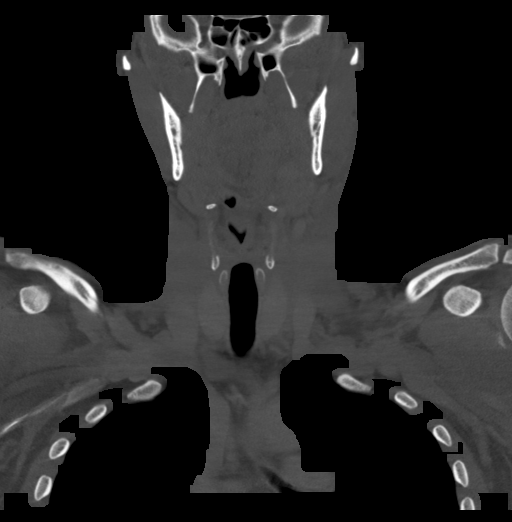
[im 60/106  bone]
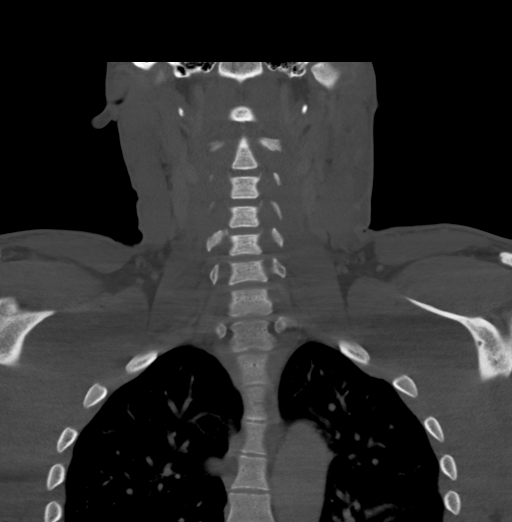

[Series 8: sag neck · sagittal · 0.47mm/px · 5 of 140 slices shown, 6 images]
[im 47/140  bone]
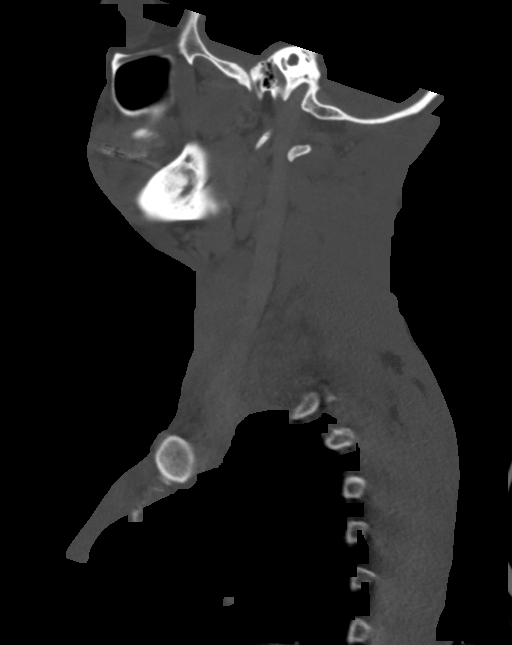
[im 58/140  bone]
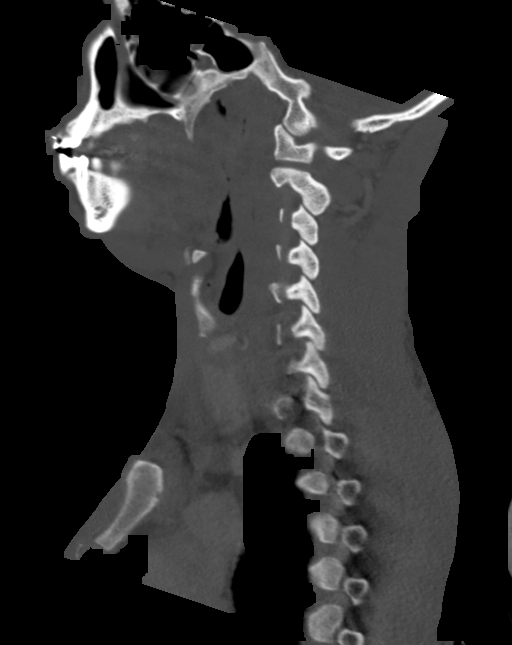
[im 70/140  soft-tissue]
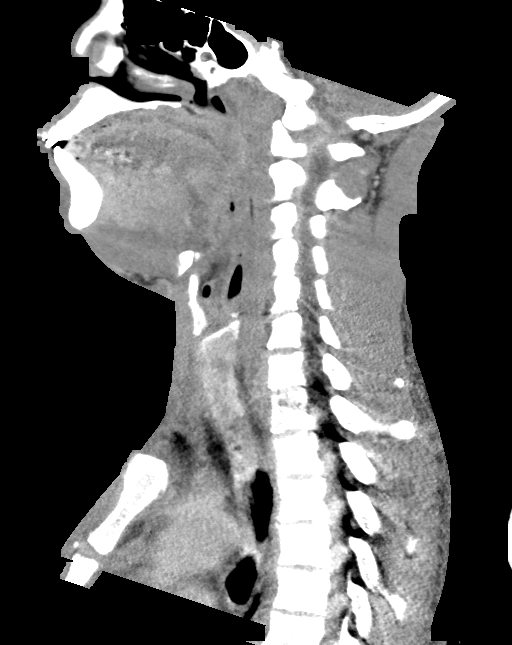
[im 70/140  bone]
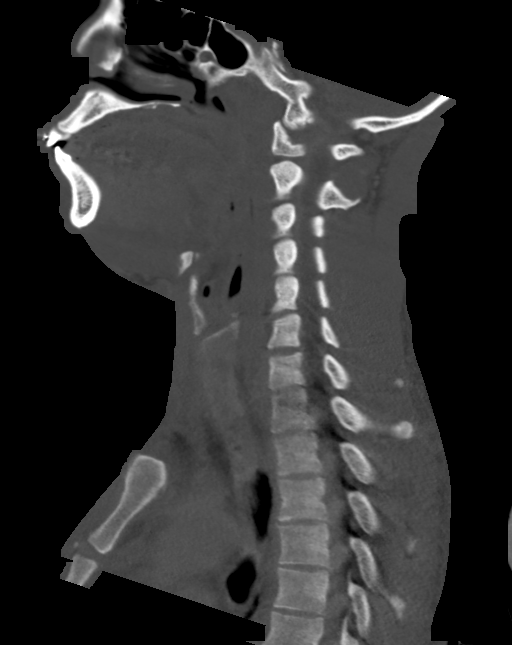
[im 82/140  bone]
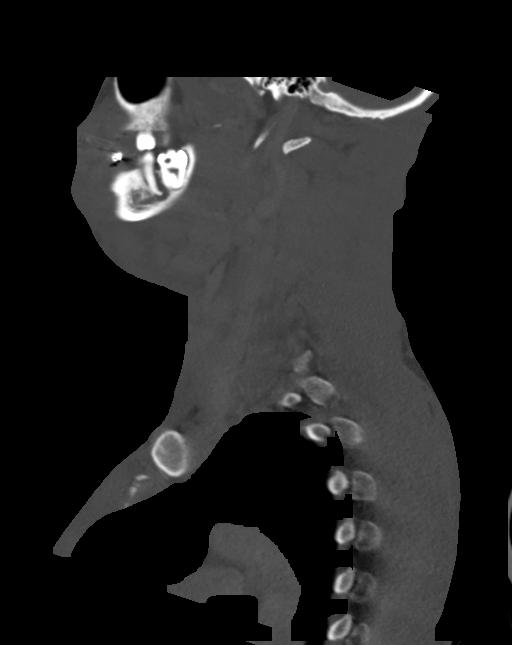
[im 93/140  bone]
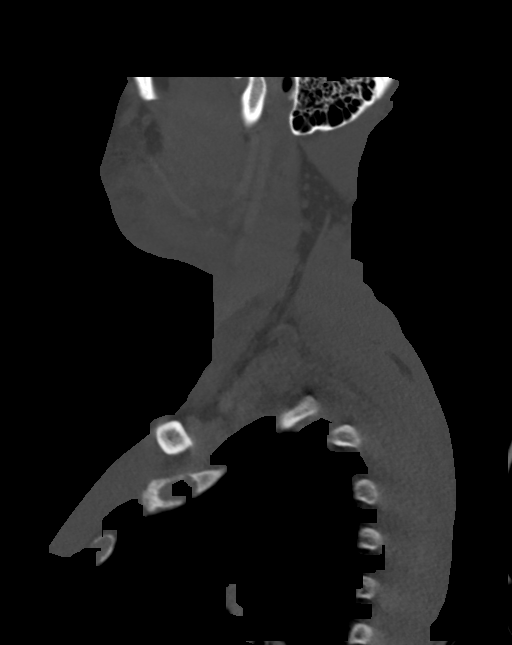

[13 of 33 positions shown; findings below may reference images not displayed]

FINDINGS: Pharynx and larynx: Negative larynx. Pharyngeal soft tissue contours
are within normal limits, however, there is generalized left
parapharyngeal space stranding and edema with mild mass effect on
the left pharynx. The retropharyngeal and right parapharyngeal
spaces remain normal.

Salivary glands:

Bilateral poor dentition. Posterior left mandibular molar with
dental caries and periapical abscess which has eroded through the
medial cortex of the left mandible (series 2, image 45 and series 4,
image 45). Confluent soft tissue inflammation surrounding the
posterior body and angle of the left mandible involvement of the
submandibular as well as the posterior left sublingual space. Medial
and inferior subperiosteal phlegmon with 1-2 cm areas of decreased
density which appear to be developing abscesses. However, these are
not well organized. The inferior lesion tracks posteriorly and
inferiorly to approach the skin surface lateral to the left
submandibular gland. See series 2, images 48 and 54 through 59. See
also coronal series 7, images 32, and 37.

Generalized left face and subcutaneous soft tissue swelling and
stranding. Mild medial and inferior displacement of the left
submandibular gland which might be secondarily inflamed. Probable
secondary inflammation of the inferior left parotid gland.

On the right there is also a periapical abscess about the highly
carious posterior right mandible molar, although this does not
appear to have eroded through to the right submandibular space
(series 4, image 48). Right submandibular gland and parotid gland
within normal limits.

Thyroid: Negative.

Lymph nodes: Mild/reactive appearing left level 1 B and level IIa
lymph nodes individually measuring up to 10 mm. No cystic or
necrotic nodes.

Vascular: Major vascular structures in the neck and at the skullbase
are patent.

Limited intracranial: Negative.

Visualized orbits: Negative.

Mastoids and visualized paranasal sinuses: Mild mucosal thickening
posterior right ethmoid. Otherwise clear.

Skeleton: Widespread poor dentition. See details above. No other
osseous abnormality identified.

Upper chest: Nodular peribronchial opacity in the posterior aspect
of the right upper lobe (series 5, image 49). Nearby unrelated small
calcified granuloma in the superior segment of the right lower lobe.
Other visualized lung parenchyma is within normal limits. Visualized
major airways are patent. No axillary or superior mediastinal
lymphadenopathy.
IMPRESSION: 1. Poor dentition. Periapical infection at the root of the left
posterior mandible molar with erosion into the left sublingual and
submandibular spaces with multifocal phlegmon. Developing abscesses
extending both medial and inferior from the area of mandible
erosion, although these do not appear well organized. The more
inferior abscess is forming at tract which is extending toward the
skin surface lateral to the will left submandibular gland. See
series 7, image 37.
2. Associated generalized left face and left parapharyngeal
cellulitis. No retropharyngeal abscess or effusion. Associated left
level 1 and level 2 reactive lymphadenopathy.
3. Superimposed right upper lobe small bronchopneumonia. No right
pleural effusion.

## 2016-01-04 MED ORDER — MORPHINE SULFATE (PF) 4 MG/ML IV SOLN
4.0000 mg | Freq: Once | INTRAVENOUS | Status: AC
  Administered 2016-01-04: 4 mg via INTRAVENOUS
  Filled 2016-01-04: qty 1

## 2016-01-04 MED ORDER — METHYLPREDNISOLONE SODIUM SUCC 125 MG IJ SOLR
125.0000 mg | Freq: Once | INTRAMUSCULAR | Status: AC
  Administered 2016-01-04: 125 mg via INTRAVENOUS
  Filled 2016-01-04: qty 2

## 2016-01-04 MED ORDER — ONDANSETRON HCL 4 MG/2ML IJ SOLN
4.0000 mg | Freq: Once | INTRAMUSCULAR | Status: AC
  Administered 2016-01-04: 4 mg via INTRAVENOUS
  Filled 2016-01-04: qty 2

## 2016-01-04 MED ORDER — SODIUM CHLORIDE 0.9 % IV SOLN
3.0000 g | Freq: Once | INTRAVENOUS | Status: AC
  Administered 2016-01-04: 3 g via INTRAVENOUS
  Filled 2016-01-04: qty 3

## 2016-01-04 MED ORDER — IOPAMIDOL (ISOVUE-300) INJECTION 61%
75.0000 mL | Freq: Once | INTRAVENOUS | Status: AC | PRN
  Administered 2016-01-04: 75 mL via INTRAVENOUS
  Filled 2016-01-04: qty 75

## 2016-01-04 MED ORDER — AMOXICILLIN-POT CLAVULANATE 875-125 MG PO TABS: 1.0000 | ORAL_TABLET | Freq: Two times a day (BID) | ORAL | 0 refills | Status: DC

## 2016-01-04 MED ORDER — AMOXICILLIN-POT CLAVULANATE 875-125 MG PO TABS
1.0000 | ORAL_TABLET | Freq: Once | ORAL | Status: AC
  Administered 2016-01-04: 1 via ORAL
  Filled 2016-01-04: qty 1

## 2016-01-04 NOTE — Discharge Instructions (Signed)
It is very important that you see Dr. Chales Salmonwsley in the morning. If you don't hear from his office, call them by 5:00 this afternoon.

## 2016-01-04 NOTE — ED Notes (Signed)
Pt states he thinks his abscess may have popped.. Pt moved to room 50  Instructed on self suctioning

## 2016-01-04 NOTE — ED Notes (Signed)
See triage note  States he was seen on sat and rx'd given pain meds and antibiotics  But swelling has increased to left side of neck  Increased pain with swallowing

## 2016-01-04 NOTE — ED Provider Notes (Signed)
[facility] Emergency Department Provider Note ____________________________________________  Time seen: Approximately 9:12 AM  I have reviewed the triage vital signs and the nursing notes.   HISTORY  Chief Complaint Oral Swelling   HPI Shawn [name] is a 30 y.o. male presents to the emergency department for evaluation of facial swelling. He was evaluated here yesterday and diagnosed with a dental abscess. He was given prescriptions for penicillin V and pain medications. He states that since he was evaluated, he has had an increase in swelling and pain. He states that now he has swelling under his tongue and had a very bad taste in his mouth. He denies fever. He denies nausea or vomiting. He denies difficulty speaking or swallowing.  History reviewed. No pertinent past medical history.  There are no active problems to display for this patient.   History reviewed. No pertinent surgical history.  Prior to Admission medications   Medication Sig Start Date End Date Taking? Authorizing Provider  amoxicillin-clavulanate (AUGMENTIN) 875-125 MG tablet Take 1 tablet by mouth 2 (two) times daily. 01/04/16   Chinita Pesterari B Sheriff Rodenberg, FNP  HYDROcodone-acetaminophen (NORCO) 5-325 MG tablet Take 1 tablet by mouth every 6 (six) hours as needed for moderate pain. 01/03/16   Jennye MoccasinBrian S Quigley, MD  ibuprofen (ADVIL,MOTRIN) 800 MG tablet Take 1 tablet (800 mg total) by mouth every 8 (eight) hours as needed. 01/03/16   Jennye MoccasinBrian S Quigley, MD  penicillin v potassium (VEETID) 500 MG tablet Take 1 tablet (500 mg total) by mouth 4 (four) times daily. 01/03/16 01/13/16  Jennye MoccasinBrian S Quigley, MD    Allergies Review of patient's allergies indicates no known allergies.  No family history on file.  Social History Social History  Substance Use Topics  . Smoking status: Current Every Day Smoker    Packs/day: 0.50    Types: Cigarettes  . Smokeless tobacco: Never Used  . Alcohol use Yes     Comment: once  a month    Review of Systems Constitutional: Acutely ill appearing. Negative for fever ENT: No nasal congestion or rhinorrhea noted, positive for dental pain Musculoskeletal: Positive for jaw pain/trismus. Skin: Positive for facial swelling on the left mandible extending to the submandibular area and crossing the midline to the right side. ____________________________________________   PHYSICAL EXAM:  VITAL SIGNS: ED Triage Vitals  Enc Vitals Group     BP 01/04/16 1000 (!) 149/96     Pulse Rate 01/04/16 1000 85     Resp 01/04/16 1000 18     Temp 01/04/16 1000 98.3 F (36.8 C)     Temp Source 01/04/16 1000 Oral     SpO2 01/04/16 1000 97 %     Weight 01/04/16 0816 190 lb (86.2 kg)     Height 01/04/16 0816 5\' 10"  (1.778 m)     Head Circumference --      Peak Flow --      Pain Score 01/04/16 0817 9     Pain Loc --      Pain Edu? --      Excl. in GC? --     Constitutional: Alert and oriented. Well appearing and in no acute distress. Eyes: Conjunctivae are normal.EOMI. Mouth/Throat: Mucous membranes are moist. Oropharynx non-erythematous. Airway patent. Uvula midline. Speech is clear. Periodontal Exam    Hematological/Lymphatic/Immunilogical: No cervical lymphadenopathy. Respiratory: Normal respiratory effort.  Musculoskeletal: Full ROM x 4 extremities. Neurologic:  Normal speech and language. No gross focal neurologic deficits are appreciated. Speech is normal. No gait  instability. Skin:  Induration of the left mandible that extends from below the TMJ, crosses the midline, and extending to the right mandible. Psychiatric: Mood and affect are normal. Speech and behavior are normal.  ____________________________________________   LABS (all labs ordered are listed, but only abnormal results are displayed)  Labs Reviewed  COMPREHENSIVE METABOLIC PANEL - Abnormal; Notable for the following:       Result Value   Total Protein 8.2 (*)    All other components within normal  limits  CBC WITH DIFFERENTIAL/PLATELET - Abnormal; Notable for the following:    WBC 12.1 (*)    Neutro Abs 9.8 (*)    Lymphs Abs 0.8 (*)    Monocytes Absolute 1.4 (*)    All other components within normal limits  CULTURE, BLOOD (ROUTINE X 2)  CULTURE, BLOOD (ROUTINE X 2)   ____________________________________________   RADIOLOGY  1. Poor dentition. Periapical infection at the root of the left  posterior mandible molar with erosion into the left sublingual and  submandibular spaces with multifocal phlegmon. Developing abscesses  extending both medial and inferior from the area of mandible  erosion, although these do not appear well organized. The more  inferior abscess is forming at tract which is extending toward the  skin surface lateral to the will left submandibular gland. See  series 7, image 37.  2. Associated generalized left face and left parapharyngeal  cellulitis. No retropharyngeal abscess or effusion. Associated left  level 1 and level 2 reactive lymphadenopathy.  3. Superimposed right upper lobe small bronchopneumonia. No right  pleural effusion.    ____________________________________________   PROCEDURES  Procedure(s) performed: None  Critical Care performed: No  ____________________________________________  1149 Case discussed with Dr. Elenore Rota who recommends either close follow up with oral surgery or transfer to a facility that can pull the tooth. Patient is without insurance and is unlikely to have close follow up available. Will call [facility] and discuss the case with an oral surgeon.   1307 Patient using [place] suction. He states that "something busted in my mouth." He has approximately 25ml of serosanguineous drainage noted in the suction canister. Pain has improved after the medications as well as spontaneous drainage.   1408 Patient continues to suction serosanguineous drainage from his mouth. No increase in pain. Voice remains clear. Continue to  await the return call from [facility] oral surgery.  INITIAL IMPRESSION / ASSESSMENT AND PLAN / ED COURSE  Pertinent labs & imaging results that were available during my care of the patient were reviewed by me and considered in my medical decision making (see chart for details).  Discussed with Dr. Chales Salmon, oral surgery at [facility], who agrees to see him in follow up tomorrow. He is aware of his insurance status.  Augmentin given for tonight's dose in case he is unable to get the Rx. Filled. Patient will be prescribed Augmentin. Patient was advised to see the dentist within 14 days. Also advised to take the antibiotic until finished. Instructed to return to the ER for symptoms that change or worsen if unable to schedule an appointment. ____________________________________________   FINAL CLINICAL IMPRESSION(S) / ED DIAGNOSES  Final diagnoses:  Periapical abscess with facial involvement    Note:  This document was prepared using Dragon voice recognition software and may include unintentional dictation errors.    Chinita Pester, FNP 01/04/16 1521    Nita Sickle, MD 01/04/16 [phone]

## 2016-01-04 NOTE — ED Notes (Signed)
Sitting up on side of stretcher   resp even and non labored  Feels better

## 2016-01-04 NOTE — ED Triage Notes (Signed)
Pt reports left sided facial swelling from bad tooth, pt reports swelling has increased down into lower jaw and pt now has sore throat. Seen here Saturday.

## 2016-01-09 LAB — CULTURE, BLOOD (ROUTINE X 2)
CULTURE: NO GROWTH
CULTURE: NO GROWTH

## 2021-09-13 ENCOUNTER — Emergency Department: Payer: 59

## 2021-09-13 ENCOUNTER — Encounter: Payer: Self-pay | Admitting: Hospitalist

## 2021-09-13 ENCOUNTER — Other Ambulatory Visit: Payer: Self-pay

## 2021-09-13 ENCOUNTER — Inpatient Hospital Stay
Admission: EM | Admit: 2021-09-13 | Discharge: 2021-09-16 | DRG: 066 | Disposition: A | Discharge status: Home / Self Care | Payer: 59 | Attending: Internal Medicine | Admitting: Internal Medicine

## 2021-09-13 DIAGNOSIS — R29703 NIHSS score 3: Secondary | ICD-10-CM | POA: Diagnosis present

## 2021-09-13 DIAGNOSIS — I6389 Other cerebral infarction: Secondary | ICD-10-CM | POA: Diagnosis not present

## 2021-09-13 DIAGNOSIS — I639 Cerebral infarction, unspecified: Secondary | ICD-10-CM | POA: Diagnosis not present

## 2021-09-13 DIAGNOSIS — G43109 Migraine with aura, not intractable, without status migrainosus: Secondary | ICD-10-CM | POA: Diagnosis present

## 2021-09-13 DIAGNOSIS — T50996A Underdosing of other drugs, medicaments and biological substances, initial encounter: Secondary | ICD-10-CM | POA: Diagnosis present

## 2021-09-13 DIAGNOSIS — R200 Anesthesia of skin: Secondary | ICD-10-CM | POA: Diagnosis present

## 2021-09-13 DIAGNOSIS — F17210 Nicotine dependence, cigarettes, uncomplicated: Secondary | ICD-10-CM | POA: Diagnosis present

## 2021-09-13 DIAGNOSIS — I10 Essential (primary) hypertension: Secondary | ICD-10-CM | POA: Diagnosis present

## 2021-09-13 DIAGNOSIS — Z91128 Patient's intentional underdosing of medication regimen for other reason: Secondary | ICD-10-CM

## 2021-09-13 DIAGNOSIS — Y92009 Unspecified place in unspecified non-institutional (private) residence as the place of occurrence of the external cause: Secondary | ICD-10-CM

## 2021-09-13 DIAGNOSIS — R2681 Unsteadiness on feet: Secondary | ICD-10-CM | POA: Diagnosis present

## 2021-09-13 DIAGNOSIS — R2990 Unspecified symptoms and signs involving the nervous system: Secondary | ICD-10-CM

## 2021-09-13 DIAGNOSIS — H5500 Unspecified nystagmus: Secondary | ICD-10-CM | POA: Diagnosis present

## 2021-09-13 DIAGNOSIS — I160 Hypertensive urgency: Secondary | ICD-10-CM | POA: Diagnosis present

## 2021-09-13 DIAGNOSIS — R278 Other lack of coordination: Secondary | ICD-10-CM | POA: Diagnosis present

## 2021-09-13 DIAGNOSIS — H532 Diplopia: Secondary | ICD-10-CM | POA: Diagnosis present

## 2021-09-13 HISTORY — DX: Essential (primary) hypertension: I10

## 2021-09-13 LAB — DIFFERENTIAL
Abs Immature Granulocytes: 0.04 10*3/uL (ref 0.00–0.07)
Basophils Absolute: 0 10*3/uL (ref 0.0–0.1)
Basophils Relative: 0 %
Eosinophils Absolute: 0.1 10*3/uL (ref 0.0–0.5)
Eosinophils Relative: 1 %
Immature Granulocytes: 0 %
Lymphocytes Relative: 13 %
Lymphs Abs: 1.3 10*3/uL (ref 0.7–4.0)
Monocytes Absolute: 0.9 10*3/uL (ref 0.1–1.0)
Monocytes Relative: 9 %
Neutro Abs: 7.1 10*3/uL (ref 1.7–7.7)
Neutrophils Relative %: 77 %

## 2021-09-13 LAB — CBC
HCT: 43.6 % (ref 39.0–52.0)
Hemoglobin: 14.5 g/dL (ref 13.0–17.0)
MCH: 30.4 pg (ref 26.0–34.0)
MCHC: 33.3 g/dL (ref 30.0–36.0)
MCV: 91.4 fL (ref 80.0–100.0)
Platelets: 216 10*3/uL (ref 150–400)
RBC: 4.77 MIL/uL (ref 4.22–5.81)
RDW: 13.7 % (ref 11.5–15.5)
WBC: 9.4 10*3/uL (ref 4.0–10.5)
nRBC: 0 % (ref 0.0–0.2)

## 2021-09-13 LAB — COMPREHENSIVE METABOLIC PANEL
ALT: 18 U/L (ref 0–44)
AST: 18 U/L (ref 15–41)
Albumin: 4.2 g/dL (ref 3.5–5.0)
Alkaline Phosphatase: 94 U/L (ref 38–126)
Anion gap: 9 (ref 5–15)
BUN: 19 mg/dL (ref 6–20)
CO2: 26 mmol/L (ref 22–32)
Calcium: 9.2 mg/dL (ref 8.9–10.3)
Chloride: 103 mmol/L (ref 98–111)
Creatinine, Ser: 1.03 mg/dL (ref 0.61–1.24)
GFR, Estimated: >60 mL/min (ref >60)
Glucose, Bld: 99 mg/dL (ref 70–99)
Potassium: 3.6 mmol/L (ref 3.5–5.1)
Sodium: 138 mmol/L (ref 135–145)
Total Bilirubin: 0.5 mg/dL (ref 0.3–1.2)
Total Protein: 7.8 g/dL (ref 6.5–8.1)

## 2021-09-13 LAB — CBG MONITORING, ED: Glucose-Capillary: 97 mg/dL (ref 70–99)

## 2021-09-13 LAB — PROTIME-INR
INR: 1.1 (ref 0.8–1.2)
Prothrombin Time: 14.5 seconds (ref 11.4–15.2)

## 2021-09-13 LAB — APTT: aPTT: 34 seconds (ref 24–36)

## 2021-09-13 IMAGING — MR MR HEAD W/O CM
7 series · 48 of 48 positions shown · non-contrast
Comparison: CT head from the same day.

CLINICAL DATA: Neuro deficit, acute, stroke suspected

EXAM:
MRI HEAD WITHOUT CONTRAST
TECHNIQUE: Multiplanar, multiecho pulse sequences of the brain and surrounding
structures were obtained without intravenous contrast.

[Series 5: ax dwi_tracew · axial · 3.0mm · 1.80mm/px · z∈[-79,+75]mm · 6 of 48 slices shown]
[im 1/48]
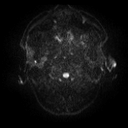
[im 10/48]
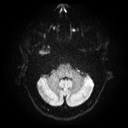
[im 19/48]
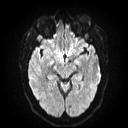
[im 29/48]
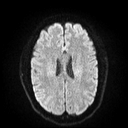
[im 38/48]
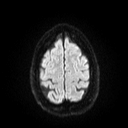
[im 48/48]
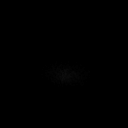

[Series 6: ax dwi_adc · axial · 3.0mm · 1.80mm/px · z∈[-79,+72]mm · 7 of 47 slices shown]
[im 1/47]
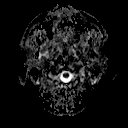
[im 8/47]
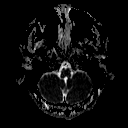
[im 16/47]
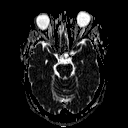
[im 24/47]
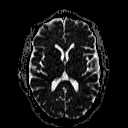
[im 31/47]
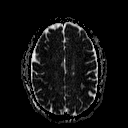
[im 39/47]
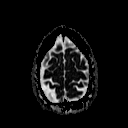
[im 47/47]
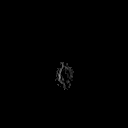

[Series 7: cor dwi_tracew · coronal · 5.0mm · 1.80mm/px · 6 of 40 slices shown]
[im 1/40]
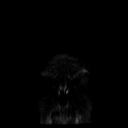
[im 8/40]
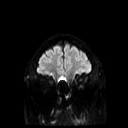
[im 16/40]
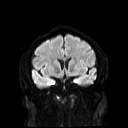
[im 24/40]
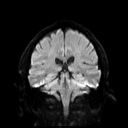
[im 32/40]
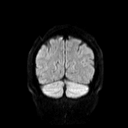
[im 40/40]
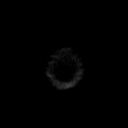

[Series 8: cor dwi_adc · coronal · 5.0mm · 1.80mm/px · 6 of 40 slices shown]
[im 1/40]
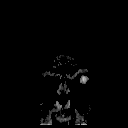
[im 8/40]
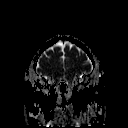
[im 16/40]
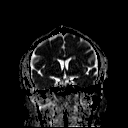
[im 24/40]
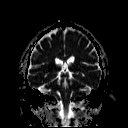
[im 32/40]
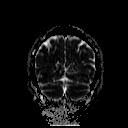
[im 40/40]
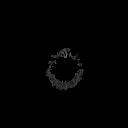

[Series 9: FLAIR · axial · 3.0mm · 0.69mm/px · z∈[-82,+76]mm · 8 of 54 slices shown]
[im 1/54]
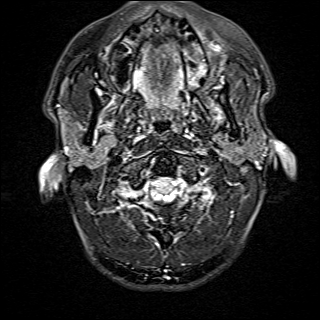
[im 8/54]
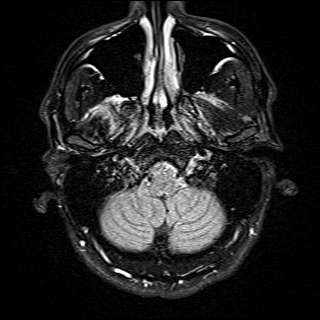
[im 16/54]
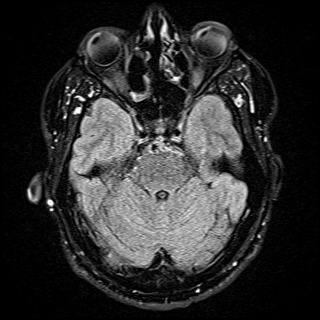
[im 23/54]
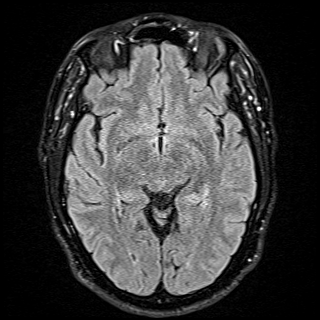
[im 31/54]
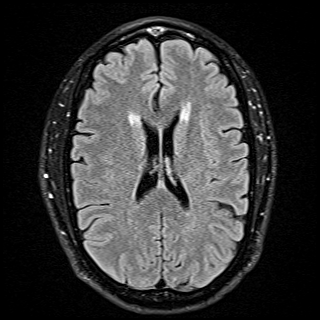
[im 38/54]
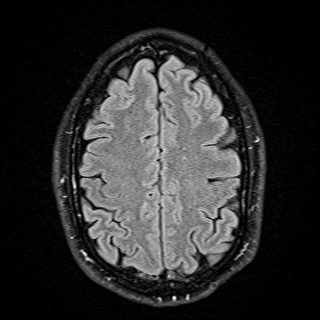
[im 46/54]
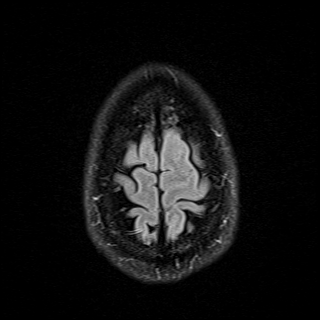
[im 54/54]
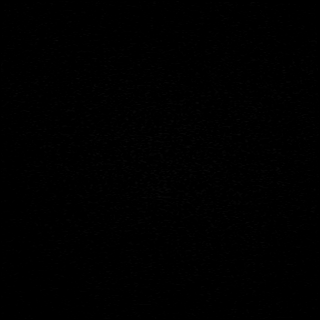

[Series 11: pha_images · axial · 3.0mm · 0.90mm/px · z∈[-76,+76]mm · 7 of 50 slices shown]
[im 1/50]
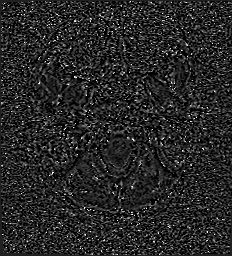
[im 9/50]
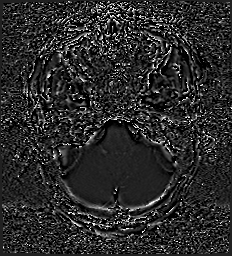
[im 17/50]
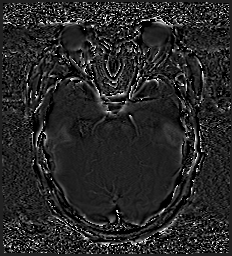
[im 25/50]
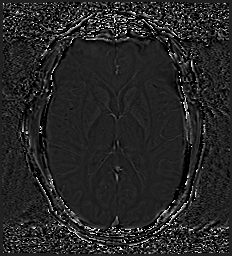
[im 33/50]
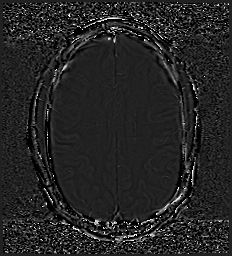
[im 41/50]
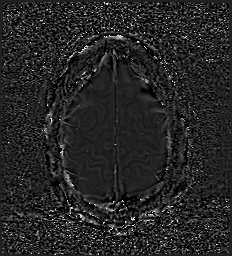
[im 50/50]
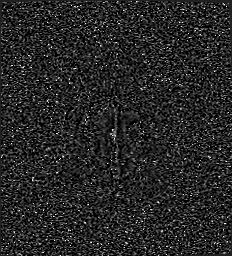

[Series 12: swi_images · axial · 3.0mm · 0.90mm/px · z∈[-76,+76]mm · 8 of 52 slices shown]
[im 1/52]
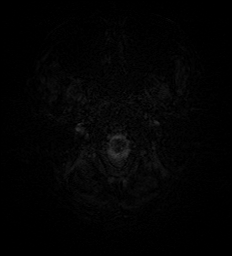
[im 8/52]
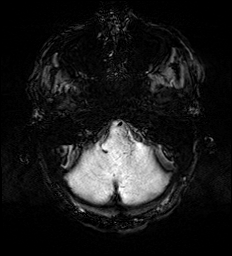
[im 15/52]
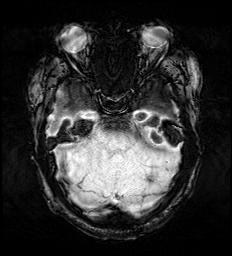
[im 22/52]
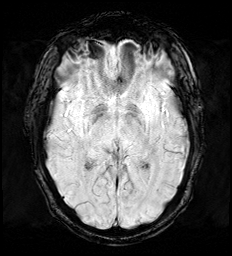
[im 30/52]
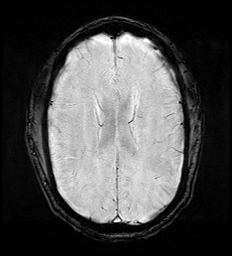
[im 37/52]
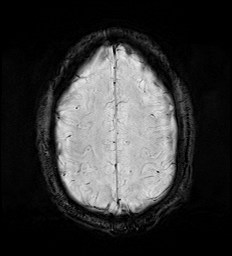
[im 44/52]
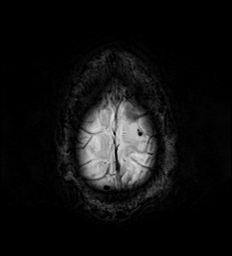
[im 52/52]
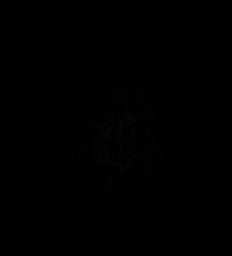

[48 of 48 positions shown; findings below may reference images not displayed]

FINDINGS: Limited stroke protocol with only DWI, FLAIR and SWI imaging. Within
this limitation:

Brain: No acute infarction, hemorrhage, hydrocephalus, extra-axial
collection or mass lesion. Small remote lacunar infarct in the left
corona radiata. Mild chronic microvascular disease.

Vascular: Limited assessment without axial T2.

Skull and upper cervical spine: Limited assessment without T1. No
obvious marrow signal abnormality.

Sinuses/Orbits: Mild paranasal sinus mucosal thickening. No obvious
acute orbital abnormality.

Other: No sizable mastoid effusions.
IMPRESSION: Limited stroke protocol without evidence of acute intracranial
abnormality.

## 2021-09-13 IMAGING — CT CT HEAD CODE STROKE
4 series · 17 of 47 positions shown, 19 images · non-contrast
Comparison: None Available.

CLINICAL DATA: Code stroke.  Neuro deficit, acute, stroke suspected



[Series 3: head wo · axial · 0.42mm/px · z∈[-73,+47]mm · 7 of 32 slices shown, 9 images]
[im 4/32  brain]
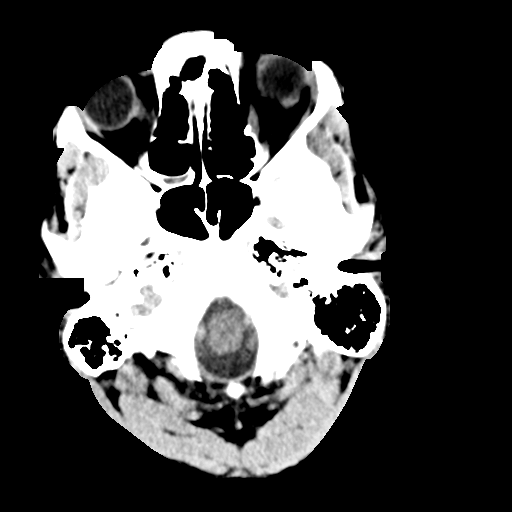
[im 4/32  bone]
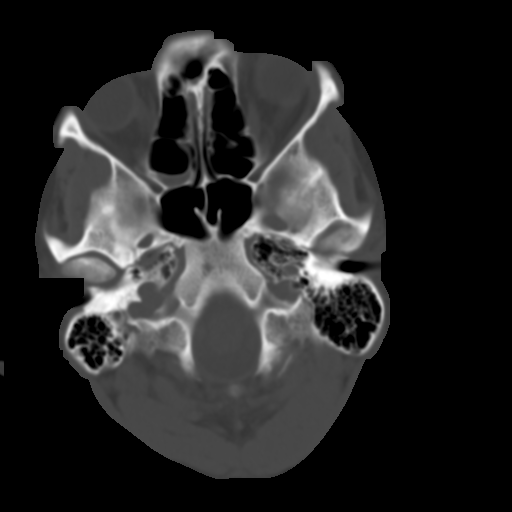
[im 8/32  brain]
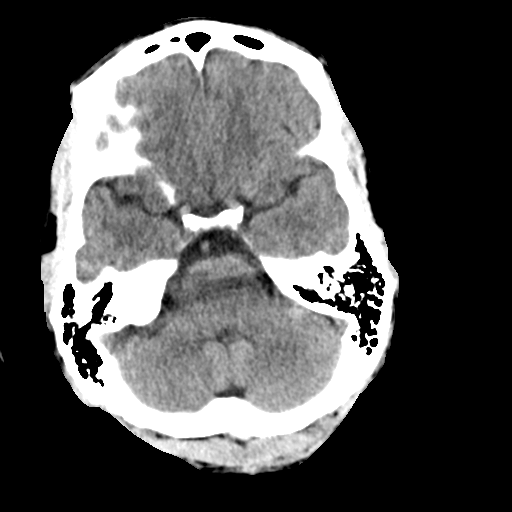
[im 12/32  brain]
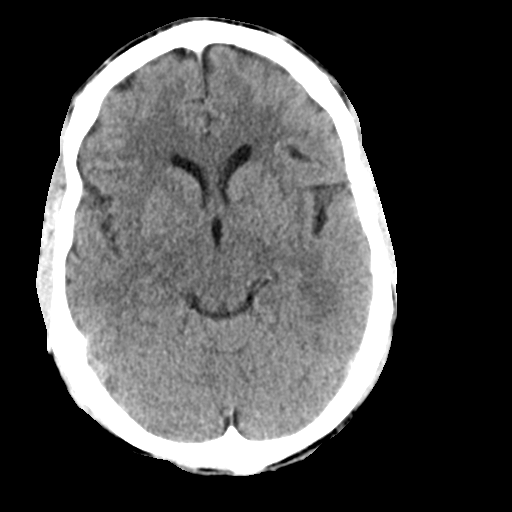
[im 16/32  brain]
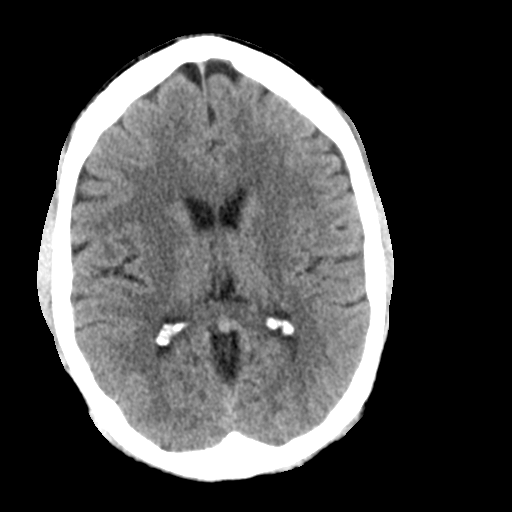
[im 20/32  brain]
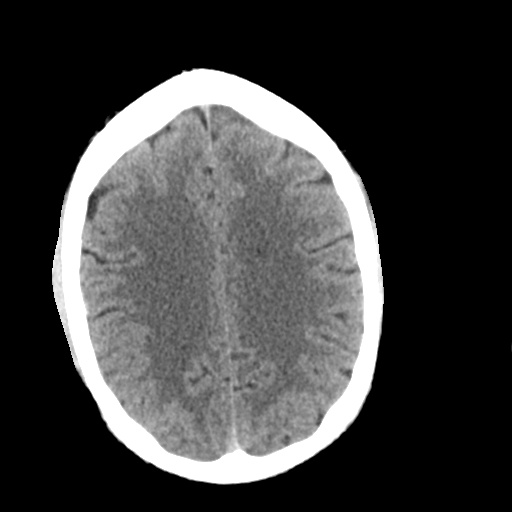
[im 20/32  bone]
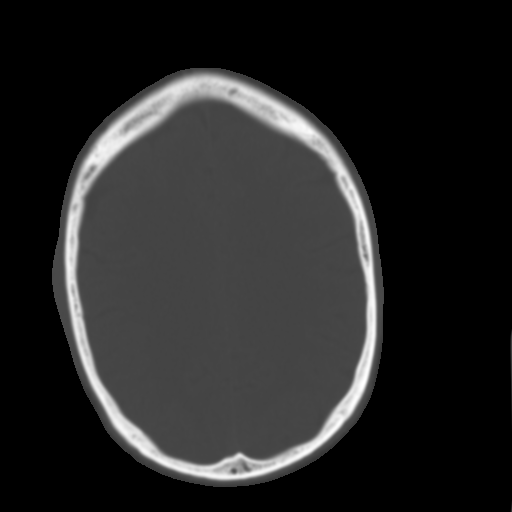
[im 24/32  brain]
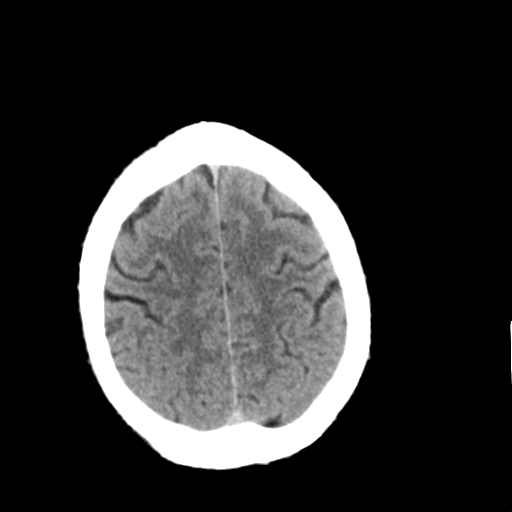
[im 28/32  brain]
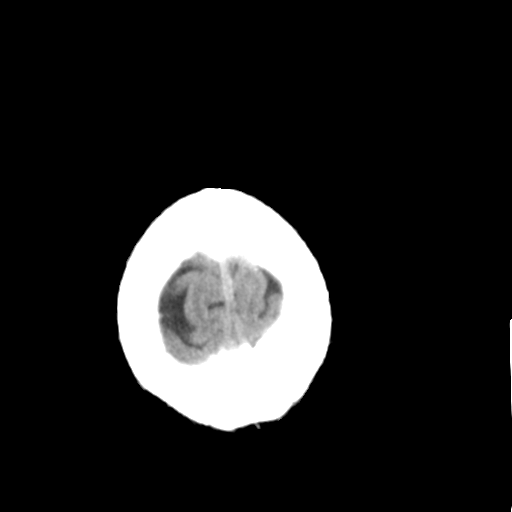

[Series 4: head bone · axial · 0.42mm/px · z∈[-74,-18]mm · 4 of 79 slices shown]
[im 8/79  bone]
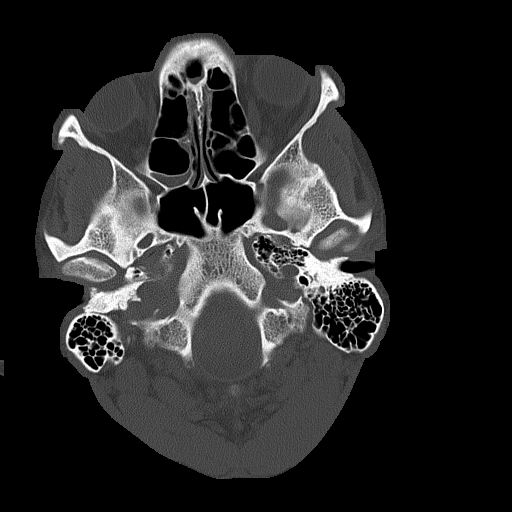
[im 16/79  bone]
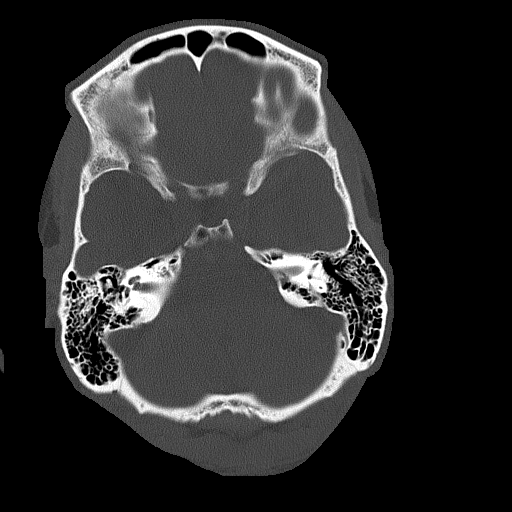
[im 24/79  bone]
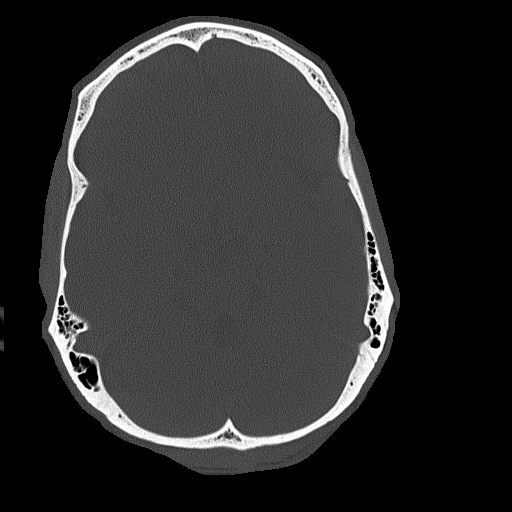
[im 36/79  bone]
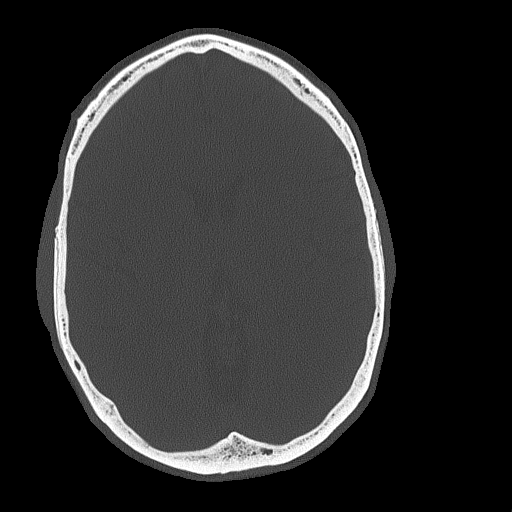

[Series 5: coronal soft tissue · coronal · 0.34mm/px · 3 of 70 slices shown]
[im 24/70  brain]
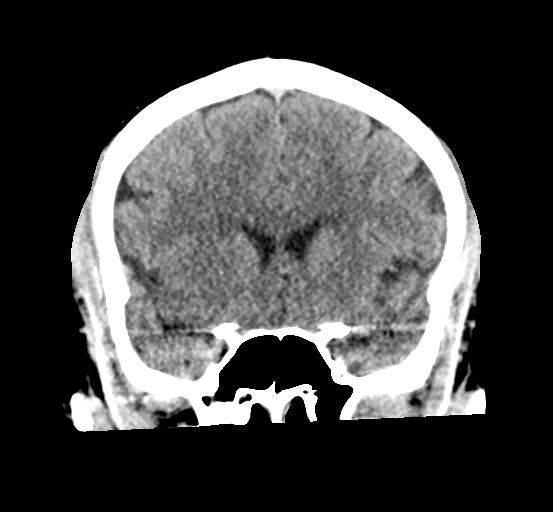
[im 31/70  brain]
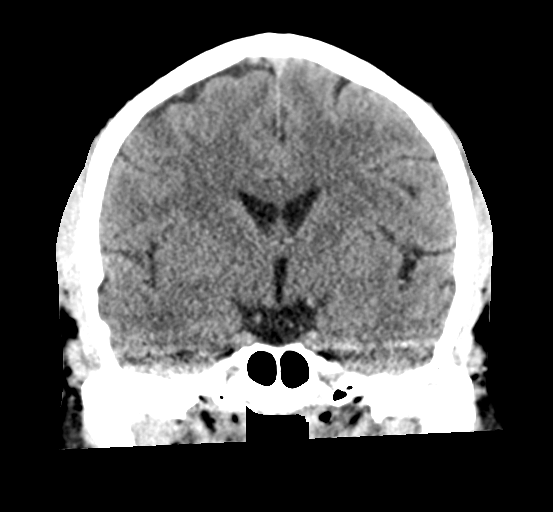
[im 39/70  brain]
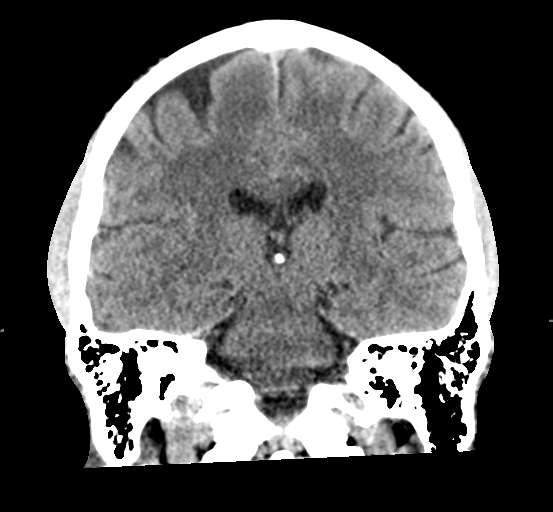

[Series 6: sagittal soft tissue · sagittal · 0.34mm/px · 3 of 64 slices shown]
[im 22/64  brain]
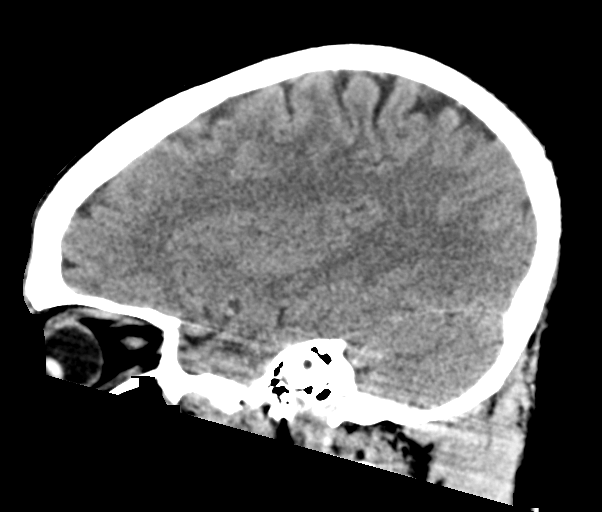
[im 32/64  brain]
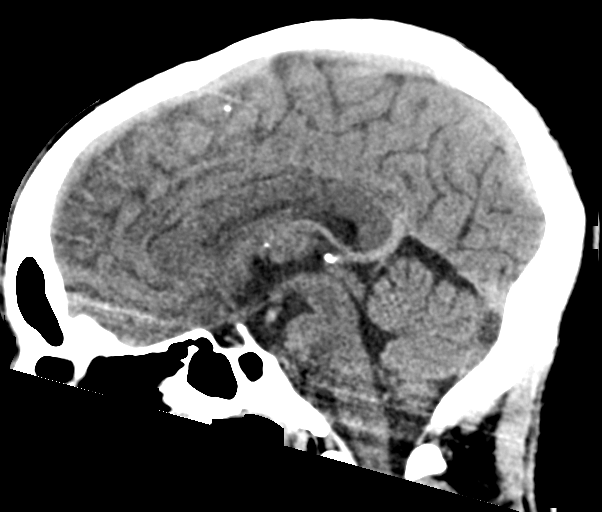
[im 43/64  brain]
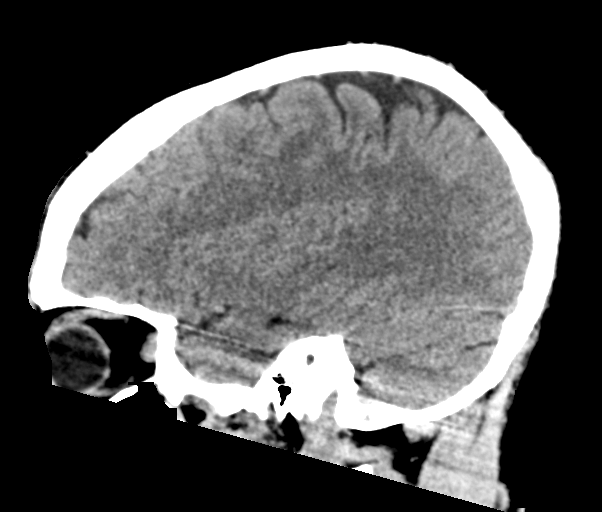

[17 of 47 positions shown; findings below may reference images not displayed]

FINDINGS: Brain: No evidence of acute large vascular territory infarction,
hemorrhage, hydrocephalus, extra-axial collection or mass
lesion/mass effect.

Vascular: No hyperdense vessel identified.

Skull: No acute fracture.

Sinuses/Orbits: Clear sinuses.

Other: No mastoid effusions.

ASPECTS ([place] Stroke Program Early CT Score) total score (0-10
with 10 being normal): 10.

Provider paged for call report at the time of dictation.
IMPRESSION: No evidence of acute intracranial abnormality. ASPECTS is 10.

## 2021-09-13 IMAGING — CT CT ANGIO HEAD-NECK (W OR W/O PERF)
2 of 7 series · 8 of 33 positions shown · non-contrast
Comparison: None Available.

CLINICAL DATA: Neuro deficit, acute, stroke suspected

EXAM:
CT ANGIOGRAPHY HEAD AND NECK
TECHNIQUE: Multidetector CT imaging of the head and neck was performed using
the standard protocol during bolus administration of intravenous
contrast. Multiplanar CT image reconstructions and MIPs were
obtained to evaluate the vascular anatomy. Carotid stenosis
measurements (when applicable) are obtained utilizing NASCET
criteria, using the distal internal carotid diameter as the
denominator.

[Series 7: cta head neck · axial · 0.40mm/px · z∈[-182,-62]mm · 2 of 182 slices shown]
[im 61/182  soft-tissue]
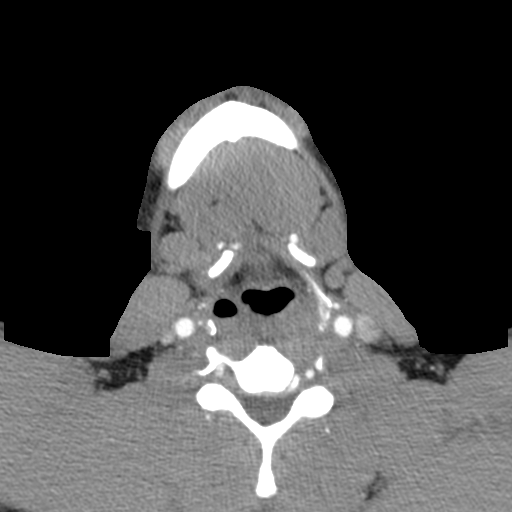
[im 121/182  bone]
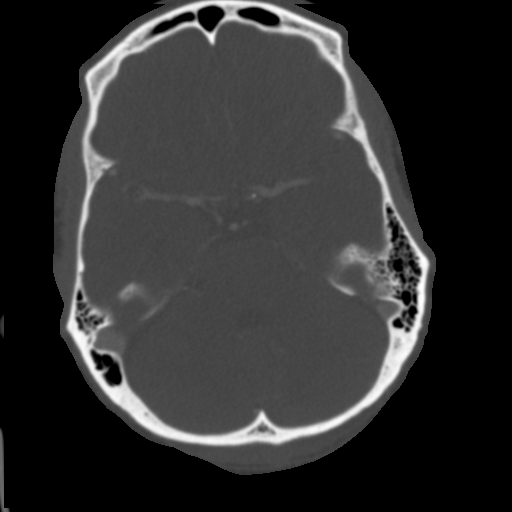

[Series 9: ax thin · axial · 0.41mm/px · z∈[-288,-53]mm · 6 of 352 slices shown]
[im 51/352  soft-tissue]
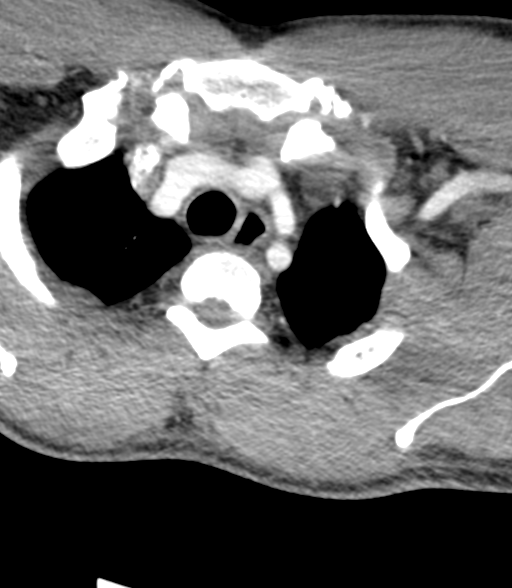
[im 101/352  soft-tissue]
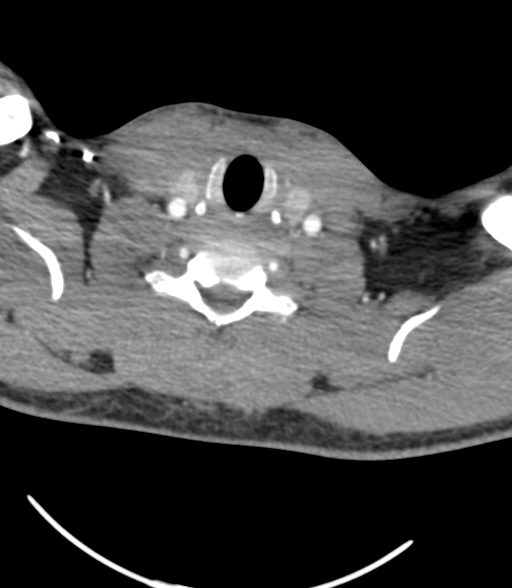
[im 151/352  soft-tissue]
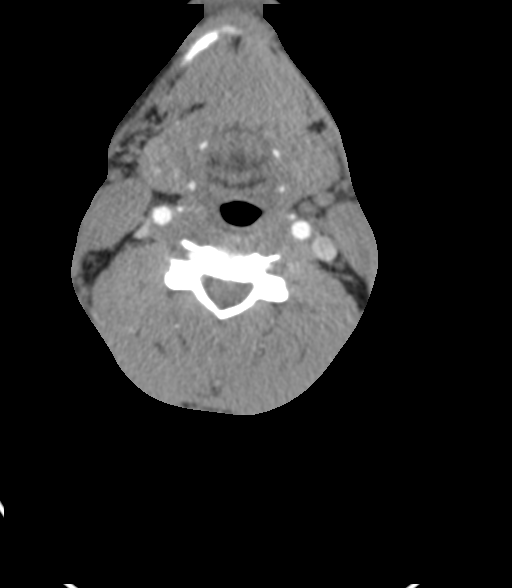
[im 201/352  soft-tissue]
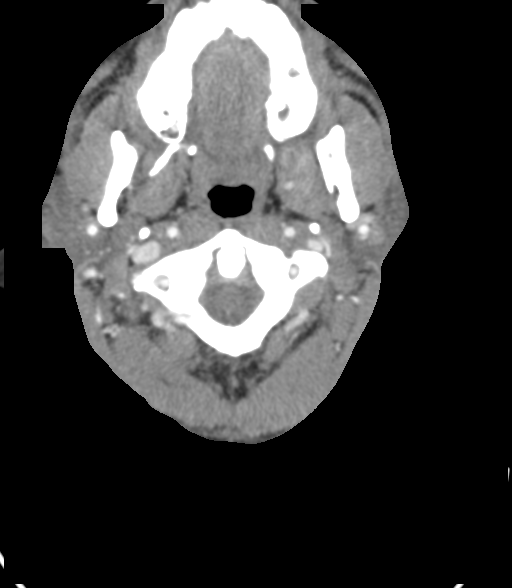
[im 251/352  soft-tissue]
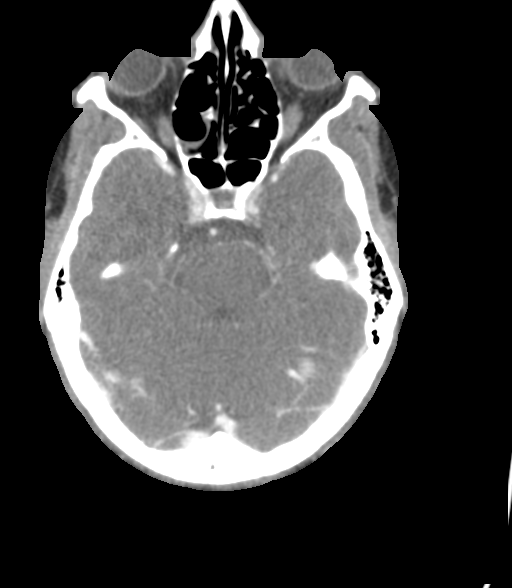
[im 301/352  soft-tissue]
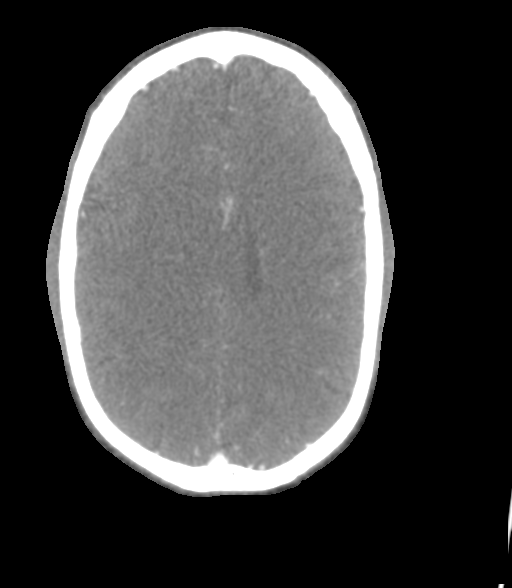

[8 of 33 positions shown; findings below may reference images not displayed]

RADIATION DOSE REDUCTION: This exam was performed according to the
departmental dose-optimization program which includes automated
exposure control, adjustment of the mA and/or kV according to
patient size and/or use of iterative reconstruction technique.

CONTRAST:  75mL OMNIPAQUE IOHEXOL 350 MG/ML SOLN
FINDINGS: CTA NECK FINDINGS

Aortic arch: Great vessel origins are patent without significant
stenosis.

Right carotid system: No evidence of dissection, stenosis (50% or
greater) or occlusion.

Left carotid system: No evidence of dissection, stenosis (50% or
greater) or occlusion.

Vertebral arteries: Right dominant. No evidence of dissection,
stenosis (50% or greater) or occlusion.

Skeleton: No acute abnormality.

Other neck: No acute abnormality.

Upper chest: Visualized lung apices are clear.

Review of the MIP images confirms the above findings

CTA HEAD FINDINGS

Evaluation limited by venous contrast bolus timing.

Anterior circulation: Bilateral intracranial ICAs, MCAs, and ACAs
are patent without proximal hemodynamically significant stenosis.

Posterior circulation: Both intradural vertebral arteries are patent
with mild left intradural vertebral artery stenosis. The basilar
artery and bilateral posterior cerebral arteries are patent without
proximal hemodynamically significant stenosis. Small right P1 PCA
with prominent right posterior communicating artery, anatomic
variant. No evidence of proximal hemodynamically significant
stenosis. No aneurysm identified.

Venous sinuses: As permitted by contrast timing, patent.

Review of the MIP images confirms the above findings
IMPRESSION: No emergent large vessel occlusion or proximal hemodynamically
significant stenosis.

Findings discussed with Dr. CELIS via telephone at [DATE] p.m.

## 2021-09-13 MED ORDER — LABETALOL HCL 5 MG/ML IV SOLN
40.0000 mg | Freq: Once | INTRAVENOUS | Status: AC
  Administered 2021-09-13: 40 mg via INTRAVENOUS

## 2021-09-13 MED ORDER — IOHEXOL 350 MG/ML SOLN
75.0000 mL | Freq: Once | INTRAVENOUS | Status: AC | PRN
  Administered 2021-09-13: 75 mL via INTRAVENOUS

## 2021-09-13 MED ORDER — HYDRALAZINE HCL 50 MG PO TABS
100.0000 mg | ORAL_TABLET | Freq: Three times a day (TID) | ORAL | Status: DC
  Administered 2021-09-13 – 2021-09-16 (×9): 100 mg via ORAL
  Filled 2021-09-13 (×9): qty 2

## 2021-09-13 MED ORDER — ENOXAPARIN SODIUM 40 MG/0.4ML IJ SOSY
40.0000 mg | PREFILLED_SYRINGE | INTRAMUSCULAR | Status: DC
  Administered 2021-09-13 – 2021-09-15 (×3): 40 mg via SUBCUTANEOUS
  Filled 2021-09-13 (×3): qty 0.4

## 2021-09-13 MED ORDER — ONDANSETRON HCL 4 MG/2ML IJ SOLN: 4.0000 mg | INTRAMUSCULAR | Status: AC

## 2021-09-13 MED ORDER — ONDANSETRON HCL 4 MG/2ML IJ SOLN
INTRAMUSCULAR | Status: AC
  Filled 2021-09-13: qty 2

## 2021-09-13 MED ORDER — LABETALOL HCL 5 MG/ML IV SOLN
10.0000 mg | Freq: Once | INTRAVENOUS | Status: AC
  Administered 2021-09-13: 10 mg via INTRAVENOUS

## 2021-09-13 MED ORDER — STROKE: EARLY STAGES OF RECOVERY BOOK: Freq: Once | Status: AC

## 2021-09-13 MED ORDER — PANTOPRAZOLE INFUSION (NEW) - SIMPLE MED
8.0000 mg/h | INTRAVENOUS | Status: DC
  Administered 2021-09-13: 8 mg/h via INTRAVENOUS
  Filled 2021-09-13: qty 100

## 2021-09-13 MED ORDER — ONDANSETRON HCL 4 MG/2ML IJ SOLN
4.0000 mg | Freq: Once | INTRAMUSCULAR | Status: AC
  Administered 2021-09-13: 4 mg via INTRAVENOUS

## 2021-09-13 MED ORDER — PANTOPRAZOLE SODIUM 40 MG IV SOLR: 40.0000 mg | Freq: Two times a day (BID) | INTRAVENOUS | Status: DC

## 2021-09-13 MED ORDER — ASPIRIN 81 MG PO CHEW
324.0000 mg | CHEWABLE_TABLET | Freq: Once | ORAL | Status: AC
  Administered 2021-09-13: 324 mg via ORAL
  Filled 2021-09-13: qty 4

## 2021-09-13 MED ORDER — TENECTEPLASE FOR STROKE
0.2500 mg/kg | PACK | Freq: Once | INTRAVENOUS | Status: DC
Start: 2021-09-13 — End: 2021-09-13

## 2021-09-13 MED ORDER — LABETALOL HCL 5 MG/ML IV SOLN
INTRAVENOUS | Status: AC
  Filled 2021-09-13: qty 8

## 2021-09-13 MED ORDER — CLEVIDIPINE BUTYRATE 0.5 MG/ML IV EMUL
INTRAVENOUS | Status: AC
  Filled 2021-09-13: qty 50

## 2021-09-13 MED ORDER — ONDANSETRON HCL 4 MG/2ML IJ SOLN
INTRAMUSCULAR | Status: AC
  Administered 2021-09-13: 4 mg via INTRAVENOUS
  Filled 2021-09-13: qty 2

## 2021-09-13 MED ORDER — ONDANSETRON 4 MG PO TBDP: 4.0000 mg | ORAL_TABLET | Freq: Three times a day (TID) | ORAL | Status: DC | PRN

## 2021-09-13 MED ORDER — SODIUM CHLORIDE 0.9% FLUSH
3.0000 mL | Freq: Once | INTRAVENOUS | Status: AC
  Administered 2021-09-13: 3 mL via INTRAVENOUS

## 2021-09-13 MED ORDER — ACETAMINOPHEN 500 MG PO TABS
1000.0000 mg | ORAL_TABLET | Freq: Three times a day (TID) | ORAL | Status: DC | PRN
  Administered 2021-09-14: 1000 mg via ORAL
  Filled 2021-09-13: qty 2

## 2021-09-13 MED ORDER — HYDRALAZINE HCL 20 MG/ML IJ SOLN
10.0000 mg | Freq: Four times a day (QID) | INTRAMUSCULAR | Status: DC | PRN
  Filled 2021-09-13: qty 1

## 2021-09-13 MED ORDER — ONDANSETRON HCL 4 MG/2ML IJ SOLN
4.0000 mg | Freq: Four times a day (QID) | INTRAMUSCULAR | Status: DC | PRN
  Administered 2021-09-13: 4 mg via INTRAVENOUS
  Filled 2021-09-13: qty 2

## 2021-09-13 MED ORDER — PANTOPRAZOLE 80MG IVPB - SIMPLE MED
80.0000 mg | Freq: Once | INTRAVENOUS | Status: AC
  Administered 2021-09-13: 80 mg via INTRAVENOUS
  Filled 2021-09-13: qty 100

## 2021-09-13 MED ORDER — CLEVIDIPINE BUTYRATE 0.5 MG/ML IV EMUL
0.0000 mg/h | INTRAVENOUS | Status: DC
  Administered 2021-09-13: 1 mg/h via INTRAVENOUS

## 2021-09-13 MED ORDER — LABETALOL HCL 5 MG/ML IV SOLN
20.0000 mg | Freq: Once | INTRAVENOUS | Status: AC
  Administered 2021-09-13: 20 mg via INTRAVENOUS

## 2021-09-13 NOTE — ED Notes (Signed)
Pt to CT

## 2021-09-13 NOTE — ED Provider Notes (Signed)
[facility] Provider Note    Event Date/Time   First MD Initiated Contact with Patient 09/13/21 1454     (approximate)   History   Dizziness   HPI  Shawn [name] is a 36 y.o. male  with no pmh who presents with unsteadiness.  Symptoms started somewhere between 11 and 1156.  Patient started feeling like he was having difficulty walking and falling to his right side.  Had mild headache initially with nausea vomiting.  Of note was drinking a red drink prior to arrival.  Denies visual change numbness tingling weakness.  No history of stroke denies alcohol use no drug use.     No past medical history on file.  There are no problems to display for this patient.    Physical Exam  Triage Vital Signs: ED Triage Vitals  Enc Vitals Group     BP 09/13/21 1351 (!) 190/128     Pulse Rate 09/13/21 1351 77     Resp 09/13/21 1351 20     Temp 09/13/21 1351 98.1 F (36.7 C)     Temp src --      SpO2 09/13/21 1351 96 %     Weight 09/13/21 1423 210 lb (95.3 kg)     Height --      Head Circumference --      Peak Flow --      Pain Score 09/13/21 1353 6     Pain Loc --      Pain Edu? --      Excl. in GC? --     Most recent vital signs: Vitals:   09/13/21 1630 09/13/21 1645  BP: (!) 150/110 (!) 155/102  Pulse: 79 65  Resp: 19 14  Temp:    SpO2: 97% 96%     General: Awake, patient appears unwell he is diaphoretic CV:  Good peripheral perfusion.  Resp:  Normal effort.  Abd:  No distention.  Neuro:             Awake, Alert, Oriented x 3  Other:  Aox3, nml speech  Left beating nystagmus horizontally no vertical nystagmus She has dysmetria with finger-nose-finger on the right normal on left Patient falls to the right when sitting up and is quite unsteady and ataxic with gait falling to the right 5 out of 5 strength with upper and lower extremity strength testing no pronator drift Alert and oriented    ED Results / Procedures / Treatments   Labs (all labs ordered are listed, but only abnormal results are displayed) Labs Reviewed  PROTIME-INR  APTT  CBC  DIFFERENTIAL  COMPREHENSIVE METABOLIC PANEL  URINE DRUG SCREEN, QUALITATIVE ([facility] ONLY)  CBG MONITORING, ED  CBG MONITORING, ED     EKG  EKG interpretation performed by myself: NSR, nml axis, nml intervals, no acute ischemic changes LVH   RADIOLOGY I reviewed and interpreted the CT scan of the brain which does not show any acute intracranial process    PROCEDURES:  Critical Care performed: Yes, see critical care procedure note(s)  .Critical Care Performed by: Georga HackingMcHugh, Torrin Crihfield Rose, MD Authorized by: Georga HackingMcHugh, Jahmire Ruffins Rose, MD   Critical care provider statement:    Critical care time (minutes):  75   Critical care was time spent personally by me on the following activities:  Development of treatment plan with patient or surrogate, discussions with consultants, evaluation of patient's response to treatment, examination of patient, ordering and review of laboratory studies, ordering and review of  radiographic studies, ordering and performing treatments and interventions, pulse oximetry, re-evaluation of patient's condition and review of old charts  The patient is on the cardiac monitor to evaluate for evidence of arrhythmia and/or significant heart rate changes.   MEDICATIONS ORDERED IN ED: Medications  labetalol (NORMODYNE) 5 MG/ML injection (has no administration in time range)  clevidipine (CLEVIPREX) infusion 0.5 mg/mL (1 mg/hr Intravenous New Bag/Given 09/13/21 1550)  ondansetron (ZOFRAN) 4 MG/2ML injection (has no administration in time range)  tenecteplase (TNKASE) injection for Stroke 24 mg (24 mg Intravenous Not Given 09/13/21 1628)  clevidipine (CLEVIPREX) 0.5 MG/ML infusion (has no administration in time range)  pantoprazole (PROTONIX) 80 mg /NS 100 mL IVPB (has no administration in time range)  pantoprozole (PROTONIX) 80 mg /NS 100 mL infusion (has no  administration in time range)  pantoprazole (PROTONIX) injection 40 mg (has no administration in time range)  sodium chloride flush (NS) 0.9 % injection 3 mL (3 mLs Intravenous Given 09/13/21 1534)  ondansetron (ZOFRAN) injection 4 mg (4 mg Intravenous Given 09/13/21 1510)  iohexol (OMNIPAQUE) 350 MG/ML injection 75 mL (75 mLs Intravenous Contrast Given 09/13/21 1443)  labetalol (NORMODYNE) injection 10 mg (10 mg Intravenous Given 09/13/21 1529)  labetalol (NORMODYNE) injection 20 mg (20 mg Intravenous Given 09/13/21 1533)  labetalol (NORMODYNE) injection 40 mg (40 mg Intravenous Given 09/13/21 1539)  ondansetron (ZOFRAN) injection 4 mg (4 mg Intravenous Given 09/13/21 1545)     IMPRESSION / MDM / ASSESSMENT AND PLAN / ED COURSE  I reviewed the triage vital signs and the nursing notes.                              Differential diagnosis includes, but is not limited to, acute posterior stroke, peripheral vertigo, intracranial hemorrhage, hypertensive emergency  The patient is a 36 year old male with no medical problems presents with acute onset of unsteadiness.  On arrival to the ED a stroke alert was called.  I evaluated the patient first while he was already in MRI.  CT head was negative as was CTA.  On my evaluation patient is having blurred vision he has left beating nystagmus dysmetria on the right and is ataxic falling to the right side.  He has about 4 hours from time of onset which is about 1130-ish.  Initially patient did not want tPA after discussion with neurology.  When he was brought back to the room in the ED and discussed with mom he then decided that he did want tPA.  Blood pressure was elevated at this time.  In the 200s.  He received multiple pushes of IV labetalol and was started on clevidipine drip.  Patient meanwhile was having multiple episodes of emesis that was dark reddish.  He had been drinking red drink prior to arrival and I initially assumed that his emesis was this, however,  would not expect this to persist with multiple episodes. I examined the emesis and it looked concerning for UGIB.  Given this is in the setting of possibly giving TNK, I tested the emesis with a hemoccult it and is grossly positive- bright blue. As patient is having multiple episodes of what looks like hematemesis and is at the end of the window for TNK I ultimately decided that the risks of bleeding outweighed the benefits and did not administer TNK.  Discussed with neurology and Dr. Timothy Lasso with gastroenterology.  Given patient is having acute neurologic symptoms would not be a good  candidate for emergent EGD.  Recommended Protonix.  We will stop clevidipine drip given now we can go with   Patient's hemoglobin is stable.  Will allow for permissive hypertension admit to the hospital service.      FINAL CLINICAL IMPRESSION(S) / ED DIAGNOSES   Final diagnoses:  Cerebrovascular accident (CVA), unspecified mechanism (HCC)     Rx / DC Orders   ED Discharge Orders     None        Note:  This document was prepared using Dragon voice recognition software and may include unintentional dictation errors.   Georga Hacking, MD 09/13/21 2122    Georga Hacking, MD 09/16/21 [id number]

## 2021-09-13 NOTE — H&P (Signed)
History and Physical    Shawn [name] [id number] DOB: 10-03-1985 DOA: 09/13/2021  PCP: Patient, No Pcp Per (Inactive)  Patient coming from: home  I have personally briefly reviewed patient's old medical records in [place]  Chief Complaint: off balance and can't walk  HPI: Shawn [name] is a 36 y.o. male with medical history significant of HTN who presented with sudden onset balance issue and unable to walk.   Pt reported balance issue affecting his walk that started between 11 am-12 pm prior to presentation.  Pt said he felt the room spinning, felt dizzy, and when he tried to walk, he leaned to the right side.  Also reported having numbness in his lips and headache in the back of the head.  No fever, dyspnea, chest pain, abdominal pain.  Pt has known HTN but not currently on BP meds.  No other medical problems.  Pt came to the ED as Code Stroke.  While in the ED, pt had 2 episodes of vomiting, first was the food he ate this morning, 2nd was brown liquidly vomitus that ED provider felt was hematemesis, so TNK was not given.  ED Course: initial vitals: afebrile, pulse 77, BP 190/128, RR 20, sating 96% on room air.  Basic labs all wnl, including Hgb.  CTA head/neck found no large vessel occlusion.  MRI brain neg for stroke.  Pt received IV labetalol 70 mg total f/b clevidipine gtt in the ED.  Pt was also started on PPI gtt.  Neuro was consulted by ED provider, and despite neg MRI, posterior fossa stroke remains on the Ddx, so pt was admitted for observation.   Assessment/Plan Principal Problem:   Stroke-like symptom  # Stroke-like symptoms --per neuro, Exam findings best localize as a right cerebellar lesion.  CTA neg for right vertebral artery dissection.  Also a consideration would be basilar migraine and hypertensive emergency accompanied by focal neurological deficits.  Plan: --Admit for observation --monitor on tele --Echo --per neuro, no need for permissive HTN, goal  SBP of 130-150. --A1c and lipid panel --PT/OT  # Hypertensive urgency --d/c clevidipine gtt. --start oral hydralazine 100 mg q8h --IV hydralazine PRN  # Hematemesis, ruled out --I have examined the vomitus, which did not look or smell like blood.  Per GI, "occult blood testing is irrelevant."  No risk factors for upper GI bleed.   Plan: --cancel GI consult for now, as agreed with Dr. Virgina Jock. --d/c IV PPI --monitor Hgb   DVT prophylaxis: Lovenox SQ Code Status: Full code  Family Communication: family updated at bedside on admission  Disposition Plan: home  Consults called: neurology Level of care: Med-Surg   Review of Systems: As per HPI otherwise complete review of systems negative.   Past Medical History:  Diagnosis Date   HTN (hypertension)     No past surgical history on file.   reports that he has been smoking cigarettes. He has been smoking an average of .5 packs per day. He has never used smokeless tobacco. He reports current alcohol use. He reports that he does not use drugs.  No Known Allergies  No family history on file.   Prior to Admission medications   Medication Sig Start Date End Date Taking? Authorizing Provider  amoxicillin-clavulanate (AUGMENTIN) 875-125 MG tablet Take 1 tablet by mouth 2 (two) times daily. Patient not taking: Reported on 09/13/2021 01/04/16   Sherrie George B, FNP  HYDROcodone-acetaminophen (NORCO) 5-325 MG tablet Take 1 tablet by mouth every 6 (six)  hours as needed for moderate pain. Patient not taking: Reported on 09/13/2021 01/03/16   Daymon Larsen, MD  ibuprofen (ADVIL,MOTRIN) 800 MG tablet Take 1 tablet (800 mg total) by mouth every 8 (eight) hours as needed. Patient not taking: Reported on 09/13/2021 01/03/16   Daymon Larsen, MD    Physical Exam: Vitals:   09/13/21 1705 09/13/21 1715 09/13/21 1730 09/13/21 1847  BP: (!) 150/103 (!) 157/105 (!) 152/105   Pulse: 61 60 80   Resp: 13 14 18    Temp:   98.8 F (37.1 C)    TempSrc:   Oral   SpO2: 96% 96% 99%   Weight:    90.8 kg  Height:    5\' 11"  (1.803 m)    Constitutional: NAD, drowsy, oriented HEENT: conjunctivae and lids normal, EOMI CV: No cyanosis.   RESP: normal respiratory effort, on RA Extremities: No effusions, edema in BLE SKIN: warm, dry Psych: Normal mood and affect.  Appropriate judgement and reason   Labs on Admission: I have personally reviewed following labs and imaging studies  CBC: Recent Labs  Lab 09/13/21 1519  WBC 9.4  NEUTROABS 7.1  HGB 14.5  HCT 43.6  MCV 91.4  PLT 123XX123   Basic Metabolic Panel: Recent Labs  Lab 09/13/21 1519  NA 138  K 3.6  CL 103  CO2 26  GLUCOSE 99  BUN 19  CREATININE 1.03  CALCIUM 9.2   GFR: Estimated Creatinine Clearance: 112.1 mL/min (by C-G formula based on SCr of 1.03 mg/dL). Liver Function Tests: Recent Labs  Lab 09/13/21 1519  AST 18  ALT 18  ALKPHOS 94  BILITOT 0.5  PROT 7.8  ALBUMIN 4.2   No results for input(s): LIPASE, AMYLASE in the last 168 hours. No results for input(s): AMMONIA in the last 168 hours. Coagulation Profile: Recent Labs  Lab 09/13/21 1519  INR 1.1   Cardiac Enzymes: No results for input(s): CKTOTAL, CKMB, CKMBINDEX, TROPONINI in the last 168 hours. BNP (last 3 results) No results for input(s): PROBNP in the last 8760 hours. HbA1C: No results for input(s): HGBA1C in the last 72 hours. CBG: Recent Labs  Lab 09/13/21 1353  GLUCAP 97   Lipid Profile: No results for input(s): CHOL, HDL, LDLCALC, TRIG, CHOLHDL, LDLDIRECT in the last 72 hours. Thyroid Function Tests: No results for input(s): TSH, T4TOTAL, FREET4, T3FREE, THYROIDAB in the last 72 hours. Anemia Panel: No results for input(s): VITAMINB12, FOLATE, FERRITIN, TIBC, IRON, RETICCTPCT in the last 72 hours. Urine analysis:    Component Value Date/Time   COLORURINE Yellow 10/22/2013 0110   APPEARANCEUR Clear 10/22/2013 0110   LABSPEC 1.023 10/22/2013 0110   PHURINE 5.0 10/22/2013  0110   GLUCOSEU Negative 10/22/2013 0110   HGBUR Negative 10/22/2013 0110   BILIRUBINUR Negative 10/22/2013 0110   KETONESUR Negative 10/22/2013 0110   PROTEINUR Negative 10/22/2013 0110   NITRITE Negative 10/22/2013 0110   LEUKOCYTESUR Negative 10/22/2013 0110    Radiological Exams on Admission: MR BRAIN WO CONTRAST  Result Date: 09/13/2021 CLINICAL DATA:  Neuro deficit, acute, stroke suspected EXAM: MRI HEAD WITHOUT CONTRAST TECHNIQUE: Multiplanar, multiecho pulse sequences of the brain and surrounding structures were obtained without intravenous contrast. COMPARISON:  CT head from the same day. FINDINGS: Limited stroke protocol with only DWI, FLAIR and SWI imaging. Within this limitation: Brain: No acute infarction, hemorrhage, hydrocephalus, extra-axial collection or mass lesion. Small remote lacunar infarct in the left corona radiata. Mild chronic microvascular disease. Vascular: Limited assessment without axial T2. Skull  and upper cervical spine: Limited assessment without T1. No obvious marrow signal abnormality. Sinuses/Orbits: Mild paranasal sinus mucosal thickening. No obvious acute orbital abnormality. Other: No sizable mastoid effusions. IMPRESSION: Limited stroke protocol without evidence of acute intracranial abnormality. Electronically Signed   By: Margaretha Sheffield M.D.   On: 09/13/2021 16:18   CT HEAD CODE STROKE WO CONTRAST  Result Date: 09/13/2021 CLINICAL DATA:  Code stroke.  Neuro deficit, acute, stroke suspected EXAM: CT HEAD WITHOUT CONTRAST TECHNIQUE: Contiguous axial images were obtained from the base of the skull through the vertex without intravenous contrast. RADIATION DOSE REDUCTION: This exam was performed according to the departmental dose-optimization program which includes automated exposure control, adjustment of the mA and/or kV according to patient size and/or use of iterative reconstruction technique. COMPARISON:  None Available. FINDINGS: Brain: No evidence of  acute large vascular territory infarction, hemorrhage, hydrocephalus, extra-axial collection or mass lesion/mass effect. Vascular: No hyperdense vessel identified. Skull: No acute fracture. Sinuses/Orbits: Clear sinuses. Other: No mastoid effusions. ASPECTS [facility] Stroke Program Early CT Score) total score (0-10 with 10 being normal): 10. Provider paged for call report at the time of dictation. IMPRESSION: No evidence of acute intracranial abnormality. ASPECTS is 10. Electronically Signed   By: Margaretha Sheffield M.D.   On: 09/13/2021 14:21   CT ANGIO HEAD NECK W WO CM (CODE STROKE)  Result Date: 09/13/2021 CLINICAL DATA:  Neuro deficit, acute, stroke suspected EXAM: CT ANGIOGRAPHY HEAD AND NECK TECHNIQUE: Multidetector CT imaging of the head and neck was performed using the standard protocol during bolus administration of intravenous contrast. Multiplanar CT image reconstructions and MIPs were obtained to evaluate the vascular anatomy. Carotid stenosis measurements (when applicable) are obtained utilizing NASCET criteria, using the distal internal carotid diameter as the denominator. RADIATION DOSE REDUCTION: This exam was performed according to the departmental dose-optimization program which includes automated exposure control, adjustment of the mA and/or kV according to patient size and/or use of iterative reconstruction technique. CONTRAST:  89mL OMNIPAQUE IOHEXOL 350 MG/ML SOLN COMPARISON:  None Available. FINDINGS: CTA NECK FINDINGS Aortic arch: Great vessel origins are patent without significant stenosis. Right carotid system: No evidence of dissection, stenosis (50% or greater) or occlusion. Left carotid system: No evidence of dissection, stenosis (50% or greater) or occlusion. Vertebral arteries: Right dominant. No evidence of dissection, stenosis (50% or greater) or occlusion. Skeleton: No acute abnormality. Other neck: No acute abnormality. Upper chest: Visualized lung apices are clear. Review of  the MIP images confirms the above findings CTA HEAD FINDINGS Evaluation limited by venous contrast bolus timing. Anterior circulation: Bilateral intracranial ICAs, MCAs, and ACAs are patent without proximal hemodynamically significant stenosis. Posterior circulation: Both intradural vertebral arteries are patent with mild left intradural vertebral artery stenosis. The basilar artery and bilateral posterior cerebral arteries are patent without proximal hemodynamically significant stenosis. Small right P1 PCA with prominent right posterior communicating artery, anatomic variant. No evidence of proximal hemodynamically significant stenosis. No aneurysm identified. Venous sinuses: As permitted by contrast timing, patent. Review of the MIP images confirms the above findings IMPRESSION: No emergent large vessel occlusion or proximal hemodynamically significant stenosis. Findings discussed with Dr. Cheral Marker via telephone at 2:45 p.m. Electronically Signed   By: Margaretha Sheffield M.D.   On: 09/13/2021 15:04      Enzo Bi MD [facility]  If 7PM-7AM, please contact night-coverage 09/13/2021, 6:52 PM

## 2021-09-13 NOTE — ED Notes (Signed)
Pt starting to vomit red colored secretions. C/o blurred vision to right

## 2021-09-13 NOTE — ED Provider Triage Note (Signed)
Emergency Medicine Provider Triage Evaluation Note  VALENTINO SAAVEDRA , a 36 y.o. male  was evaluated in triage.  Pt complains of headache, elevated blood pressure, unable to walk without help.  Review of Systems  Positive: See above Negative: Facial droop, numbness or tingling  Physical Exam  BP (!) 190/128   Pulse 77   Temp 98.1 F (36.7 C)   Resp 20   SpO2 96%  Gen:   Awake, no distress   Resp:  Normal effort  MSK:   Moves extremities without difficulty  Other:    Medical Decision Making  Medically screening exam initiated at 1:54 PM.  Appropriate orders placed.  Linford Arnold was informed that the remainder of the evaluation will be completed by another provider, this initial triage assessment does not replace that evaluation, and the importance of remaining in the ED until their evaluation is complete.  Due to patient being symptomatic with headache and severely elevated blood pressure along with dizziness will initiate code stroke   Faythe Ghee, PA-C 09/13/21 1355

## 2021-09-13 NOTE — Consult Note (Signed)
NEURO HOSPITALIST CONSULT NOTE   Requestig physician: Dr. Sidney Ace  Reason for Consult: Acute onset of gait unsteadiness with posterior nuchal and head pain.  History obtained from:  Patient and Chart     HPI:                                                                                                                                          Shawn [name] is an 36 y.o. male with no significant PMHx other than HTN noted at a prior ED visit for back pain who presents to the ED with acute onset of gait unsteadiness with posterior nuchal and head pain.   Per Triage RN note: "Pt reports unable to walk steady starting between 11-12, reports headache. Equal grip and strength. Face symmetrical Cbg 97"  A Code Stroke was called and the patient was emergently brought to CT.   No past medical history on file.  No past surgical history on file.  No family history on file.             Social History:  reports that he has been smoking cigarettes. He has been smoking an average of .5 packs per day. He has never used smokeless tobacco. He reports current alcohol use. He reports that he does not use drugs.  No Known Allergies  HOME MEDICATIONS:                                                                                                                      No current facility-administered medications on file prior to encounter.   Current Outpatient Medications on File Prior to Encounter  Medication Sig Dispense Refill   amoxicillin-clavulanate (AUGMENTIN) 875-125 MG tablet Take 1 tablet by mouth 2 (two) times daily. (Patient not taking: Reported on 09/13/2021) 20 tablet 0   HYDROcodone-acetaminophen (NORCO) 5-325 MG tablet Take 1 tablet by mouth every 6 (six) hours as needed for moderate pain. (Patient not taking: Reported on 09/13/2021) 20 tablet 0   ibuprofen (ADVIL,MOTRIN) 800 MG tablet Take 1 tablet (800 mg total) by mouth every 8 (eight) hours as needed. (Patient not  taking: Reported on 09/13/2021) 30 tablet 0     ROS:  Complains of a new onset posterior neck and head pain as well as swaying dizziness "like floating on a boat". Also with nausea followed by vomiting of new onset while in CT. No other complaints. Comprehensive ROS deferred due to acuity of presentation.   Blood pressure (!) 190/128, pulse 77, temperature 98.1 F (36.7 C), resp. rate 20, weight 95.3 kg, SpO2 96 %.   General Examination:                                                                                                       Physical Exam  HEENT-  [place]/AT  Lungs- Respirations unlabored Extremities- No edema  Neurological Examination Mental Status: Awake, alert and oriented. Speech fluent with intact comprehension and naming.  Cranial Nerves: II: Fixates and tracks normally. PERRL.   III,IV, VI: No ptosis. EOMI without nystagmus.  V: Temp sensation equal bilaterally.  VII: Smile symmetric  VIII: Hearing intact to voice IX,X: Palate elevates symmetrically XI: Symmetric shoulder shrug XII: Midline tongue extension Motor: RUE 5/5 proximally and distally RLE 5/5 proximally and distally LUE 5/5 proximally and distally LLE 5/5 proximally and distally No pronator drift Negative rotating fingers test Sensory: Temp and light touch sensation intact throughout, bilaterally Deep Tendon Reflexes: 2+ and symmetric throughout Cerebellar: No ataxia with FNF on the right, but with slowed movements. Normal FNF on the left. RAM is slow bilaterally, worse on the right, but without dysdiadochokinesia. Has difficulty with H-S bilaterally, but patient attributes this to stiffness with no objective ataxia noted.  Gait: Markedly unsteady gait with leaning and occasional initiation of falls to the right; caught by examiners. Leans to the right when sitting.     Lab Results: Basic Metabolic Panel: No results for input(s): NA, K, CL, CO2, GLUCOSE, BUN, CREATININE, CALCIUM, MG, PHOS in the last 168 hours.  CBC: No results for input(s): WBC, NEUTROABS, HGB, HCT, MCV, PLT in the last 168 hours.  Cardiac Enzymes: No results for input(s): CKTOTAL, CKMB, CKMBINDEX, TROPONINI in the last 168 hours.  Lipid Panel: No results for input(s): CHOL, TRIG, HDL, CHOLHDL, VLDL, LDLCALC in the last 168 hours.  Imaging: CT HEAD CODE STROKE WO CONTRAST  Result Date: 09/13/2021 CLINICAL DATA:  Code stroke.  Neuro deficit, acute, stroke suspected EXAM: CT HEAD WITHOUT CONTRAST TECHNIQUE: Contiguous axial images were obtained from the base of the skull through the vertex without intravenous contrast. RADIATION DOSE REDUCTION: This exam was performed according to the departmental dose-optimization program which includes automated exposure control, adjustment of the mA and/or kV according to patient size and/or use of iterative reconstruction technique. COMPARISON:  None Available. FINDINGS: Brain: No evidence of acute large vascular territory infarction, hemorrhage, hydrocephalus, extra-axial collection or mass lesion/mass effect. Vascular: No hyperdense vessel identified. Skull: No acute fracture. Sinuses/Orbits: Clear sinuses. Other: No mastoid effusions. ASPECTS [facility]([place] Stroke Program Early CT Score) total score (0-10 with 10 being normal): 10. Provider paged for call report at the time of dictation. IMPRESSION: No evidence of acute intracranial abnormality. ASPECTS is 10. Electronically Signed   By: Juluis MireFrederick S Jones M.D.  On: 09/13/2021 14:21     Assessment: 36 year old male presenting with acute onset of dizziness and gait instability with veering to the right. Severe nausea with vomiting in CT treated with a STAT dose of 4 mg IV Zofran.  1. Exam findings best localize as a right cerebellar lesion, most likely an acute infarction.  2. Given his young age and posterior  neck and head pain near the time of symptom onset, acute right vertebral artery dissection is highest on the DDx. Also a consideration would be basilar migraine and hypertensive emergency accompanied by focal neurological deficits.  3. CT head: No acute abnormality. ASPECTS 10.  4. STAT CTA of head and neck: No emergent large vessel occlusion or proximal hemodynamically significant stenosis. No dissection noted.  5. STAT MRI brain without acute infarction on the DWI images. A chronic periventricular white matter microinfarction was noted in addition to early changes most consistent with chronic small vessel disease.  6. The patient initially noted to have no absolute contraindications to TNK based on most recent guidelines and he did consent to TNK after initial refusal following discussion of risks/benefits of TNK versus no TNK with Neurologist. However, vomitus in CT which was initially thought to be brown colored due to patient recently drinking a brown colored soft-drink, was revealed to be hemoccult positive. Due to active bleeding, TNK contraindicated and not given.  7. Of note, MRI-negative posterior fossa stroke is on the DDx despite negative MRI.    Recommendations: 1. Frequent neuro checks 2. Given that hypertensive emergency is high on the DDx and likely small volume of ischemic tissue if presentation is due to ischemia, which would be unlikely to be associated with a significant penumbra, recommend treatment of HTN with goal SBP of 130-150.  3. TTE 4. Cardiac telemetry 5. Management of upper GIB. Hold of on ASA until source for bleed is identified and treated.  6. PT/OT/Speech 7. Statin. 8. Most likely will need to be started on a scheduled PO antihypertensive prior to discharge. 9. Home BP monitoring on a daily basis after discharge.  10. HgbA1c, fasting lipid panel 11. NPO until passes stroke swallow screen    Electronically signed: Dr. Caryl Pina 09/13/2021, 2:34 PM

## 2021-09-13 NOTE — ED Notes (Signed)
Code  stroke  called  to  [facility] 

## 2021-09-13 NOTE — ED Triage Notes (Signed)
Pt reports unable to walk steady starting between 11-12, reports headache. Equal grip and strength. Face symmetrical  Cbg 97

## 2021-09-13 NOTE — ED Notes (Signed)
Brandon RN aware of assigned bed °

## 2021-09-13 NOTE — Progress Notes (Signed)
   09/13/21 1530  Clinical Encounter Type  Visited With Patient and family together  Visit Type Initial;Code  Consult/Referral To Chaplain  Spiritual Encounters  Spiritual Needs Emotional;Prayer   Chaplain followed up on Code Stroke as patient and family was not in room earlier. Chaplain provided support through presence and prayer.

## 2021-09-14 ENCOUNTER — Observation Stay (HOSPITAL_COMMUNITY)
Admit: 2021-09-14 | Discharge: 2021-09-14 | Disposition: A | Discharge status: Home / Self Care | Payer: 59 | Attending: Hospitalist | Admitting: Hospitalist

## 2021-09-14 DIAGNOSIS — I10 Essential (primary) hypertension: Secondary | ICD-10-CM | POA: Diagnosis present

## 2021-09-14 DIAGNOSIS — T50996A Underdosing of other drugs, medicaments and biological substances, initial encounter: Secondary | ICD-10-CM | POA: Diagnosis present

## 2021-09-14 DIAGNOSIS — G43109 Migraine with aura, not intractable, without status migrainosus: Secondary | ICD-10-CM | POA: Diagnosis present

## 2021-09-14 DIAGNOSIS — I6389 Other cerebral infarction: Secondary | ICD-10-CM | POA: Diagnosis present

## 2021-09-14 DIAGNOSIS — H5500 Unspecified nystagmus: Secondary | ICD-10-CM | POA: Diagnosis present

## 2021-09-14 DIAGNOSIS — R200 Anesthesia of skin: Secondary | ICD-10-CM | POA: Diagnosis present

## 2021-09-14 DIAGNOSIS — I160 Hypertensive urgency: Secondary | ICD-10-CM

## 2021-09-14 DIAGNOSIS — Z91128 Patient's intentional underdosing of medication regimen for other reason: Secondary | ICD-10-CM | POA: Diagnosis not present

## 2021-09-14 DIAGNOSIS — R278 Other lack of coordination: Secondary | ICD-10-CM | POA: Diagnosis present

## 2021-09-14 DIAGNOSIS — R2990 Unspecified symptoms and signs involving the nervous system: Secondary | ICD-10-CM

## 2021-09-14 DIAGNOSIS — R29703 NIHSS score 3: Secondary | ICD-10-CM | POA: Diagnosis present

## 2021-09-14 DIAGNOSIS — I639 Cerebral infarction, unspecified: Secondary | ICD-10-CM | POA: Diagnosis not present

## 2021-09-14 DIAGNOSIS — H532 Diplopia: Secondary | ICD-10-CM | POA: Diagnosis present

## 2021-09-14 DIAGNOSIS — Y92009 Unspecified place in unspecified non-institutional (private) residence as the place of occurrence of the external cause: Secondary | ICD-10-CM | POA: Diagnosis not present

## 2021-09-14 DIAGNOSIS — R2681 Unsteadiness on feet: Secondary | ICD-10-CM | POA: Diagnosis present

## 2021-09-14 DIAGNOSIS — F17210 Nicotine dependence, cigarettes, uncomplicated: Secondary | ICD-10-CM | POA: Diagnosis present

## 2021-09-14 LAB — BASIC METABOLIC PANEL
Anion gap: 9 (ref 5–15)
BUN: 22 mg/dL — ABNORMAL HIGH (ref 6–20)
CO2: 26 mmol/L (ref 22–32)
Calcium: 8.9 mg/dL (ref 8.9–10.3)
Chloride: 106 mmol/L (ref 98–111)
Creatinine, Ser: 1.23 mg/dL (ref 0.61–1.24)
GFR, Estimated: >60 mL/min (ref >60)
Glucose, Bld: 88 mg/dL (ref 70–99)
Potassium: 3.3 mmol/L — ABNORMAL LOW (ref 3.5–5.1)
Sodium: 141 mmol/L (ref 135–145)

## 2021-09-14 LAB — ECHOCARDIOGRAM COMPLETE
Area-P 1/2: 3.36 cm2
Height: 71 in
S' Lateral: 3.1 cm
Weight: 3203.2 oz

## 2021-09-14 LAB — CBC
HCT: 39 % (ref 39.0–52.0)
Hemoglobin: 13 g/dL (ref 13.0–17.0)
MCH: 30.2 pg (ref 26.0–34.0)
MCHC: 33.3 g/dL (ref 30.0–36.0)
MCV: 90.5 fL (ref 80.0–100.0)
Platelets: 216 10*3/uL (ref 150–400)
RBC: 4.31 MIL/uL (ref 4.22–5.81)
RDW: 13.7 % (ref 11.5–15.5)
WBC: 9 10*3/uL (ref 4.0–10.5)
nRBC: 0 % (ref 0.0–0.2)

## 2021-09-14 LAB — LIPID PANEL
Cholesterol: 155 mg/dL (ref 0–200)
HDL: 32 mg/dL — ABNORMAL LOW (ref >40)
LDL Cholesterol: 96 mg/dL (ref 0–99)
Total CHOL/HDL Ratio: 4.8 RATIO
Triglycerides: 135 mg/dL (ref <150)
VLDL: 27 mg/dL (ref 0–40)

## 2021-09-14 LAB — HEMOGLOBIN A1C
Hgb A1c MFr Bld: 5.6 % (ref 4.8–5.6)
Mean Plasma Glucose: 114.02 mg/dL

## 2021-09-14 LAB — MAGNESIUM: Magnesium: 2.4 mg/dL (ref 1.7–2.4)

## 2021-09-14 LAB — HIV ANTIBODY (ROUTINE TESTING W REFLEX): HIV Screen 4th Generation wRfx: NONREACTIVE

## 2021-09-14 MED ORDER — ASPIRIN 81 MG PO TBEC
81.0000 mg | DELAYED_RELEASE_TABLET | Freq: Every day | ORAL | Status: DC
  Administered 2021-09-14 – 2021-09-16 (×3): 81 mg via ORAL
  Filled 2021-09-14 (×3): qty 1

## 2021-09-14 MED ORDER — LISINOPRIL 20 MG PO TABS
40.0000 mg | ORAL_TABLET | Freq: Every day | ORAL | Status: DC
  Administered 2021-09-14 – 2021-09-16 (×3): 40 mg via ORAL
  Filled 2021-09-14 (×3): qty 2

## 2021-09-14 MED ORDER — POTASSIUM CHLORIDE CRYS ER 20 MEQ PO TBCR
40.0000 meq | EXTENDED_RELEASE_TABLET | Freq: Once | ORAL | Status: AC
  Administered 2021-09-14: 40 meq via ORAL
  Filled 2021-09-14: qty 2

## 2021-09-14 NOTE — Progress Notes (Signed)
Echocardiogram 2D Echocardiogram has been performed.  Darlina Sicilian M 09/14/2021, 9:14 AM

## 2021-09-14 NOTE — Evaluation (Signed)
Occupational Therapy Evaluation Patient Details Name: Shawn [name] MRN: [id number] DOB: Jun 15, 1985 Today's Date: 09/14/2021   History of Present Illness Shawn [name] is a 36 y.o. male with medical history significant of HTN who presented with sudden onset balance issue, lip numbness, headacheand, and unable to walk.   Clinical Impression   Shawn [name] was seen for OT evaluation this date. Prior to hospital admission, pt was Independent for all mobility and IADLs including working 2 jobs. Pt lives with his brother in home c 3 STE. Pt presents to acute OT demonstrating impaired ADL performance and functional mobility 2/2 decreased activity tolerance and functional balance deficits. Pt endorses tingling along R side of face.  Pt currently requires SETUP don shoes in sitting. MOD A for toilet t/f - initially attempts to urinate in standing however poor balance with wide BOS, elects to complete sitting. Pt tolerates ~15 ft functional mobility, initial MIN A decreasing to MOD A - assist for multiple lateral LOBs, pt reaching out for UE support on walls/furniture. MIN A for clothing mgmt in standing - assist for balance. MIN A grooming standing sink side - assist for dynamic balance. Pt would benefit from skilled OT to address noted impairments and functional limitations (see below for any additional details). Upon hospital discharge, recommend CIR to maximize pt safety and return to PLOF.   Recommendations for follow up therapy are one component of a multi-disciplinary discharge planning process, led by the attending physician.  Recommendations may be updated based on patient status, additional functional criteria and insurance authorization.   Follow Up Recommendations  Acute inpatient rehab (3hours/day)    Assistance Recommended at Discharge Intermittent Supervision/Assistance  Patient can return home with the following A lot of help with walking and/or transfers;A little help with  bathing/dressing/bathroom;Help with stairs or ramp for entrance    Functional Status Assessment  Patient has had a recent decline in their functional status and demonstrates the ability to make significant improvements in function in a reasonable and predictable amount of time.  Equipment Recommendations  BSC/3in1    Recommendations for Other Services       Precautions / Restrictions Precautions Precautions: Fall Restrictions Weight Bearing Restrictions: No      Mobility Bed Mobility Overal bed mobility: Modified Independent             General bed mobility comments: increased time    Transfers Overall transfer level: Needs assistance Equipment used: None Transfers: Sit to/from Stand Sit to Stand: Min assist           General transfer comment: decreasing to MOD A for ~15 ft mobility - assist for multiple lateral LOBs, pt reaching out for UE support on walls/furniture      Balance Overall balance assessment: Needs assistance Sitting-balance support: No upper extremity supported, Feet supported Sitting balance-Leahy Scale: Good     Standing balance support: During functional activity, No upper extremity supported Standing balance-Leahy Scale: Poor                             ADL either performed or assessed with clinical judgement   ADL Overall ADL's : Needs assistance/impaired                                       General ADL Comments: SETUP don shoes in sitting. MOD A for toilet t/f -  initially attempts to urinate in standing however poor balance with wide BOS, elects to complete sitting. MIN A for clothing mgmt in stnading - assist for balance. MIN A grooming standing sink side - assist for dynamic balance.      Pertinent Vitals/Pain Pain Assessment Pain Assessment: 0-10 Pain Score: 5  Pain Location: headache Pain Descriptors / Indicators: Headache Pain Intervention(s): Repositioned, Patient requesting pain meds-RN  notified     Hand Dominance Right   Extremity/Trunk Assessment Upper Extremity Assessment Upper Extremity Assessment: Overall WFL for tasks assessed   Lower Extremity Assessment Lower Extremity Assessment: Defer to PT evaluation       Communication Communication Communication: Expressive difficulties   Cognition Arousal/Alertness: Awake/alert Behavior During Therapy: Flat affect Overall Cognitive Status: Impaired/Different from baseline Area of Impairment: Safety/judgement                         Safety/Judgement: Decreased awareness of deficits           General Comments  Reclined in chair: BP 160/104, MPA 119, HR 61            Home Living Family/patient expects to be discharged to:: Private residence Living Arrangements: Other relatives (brother) Available Help at Discharge: Family Type of Home: House Home Access: Stairs to enter Secretary/administratorntrance Stairs-Number of Steps: 3 Entrance Stairs-Rails: Can reach both Home Layout: One level               Home Equipment: None          Prior Functioning/Environment Prior Level of Function : Independent/Modified Independent;Working/employed;Driving               ADLs Comments: drives a forklift for work, works 2 jobs        OT Problem List: Decreased strength;Decreased activity tolerance;Impaired balance (sitting and/or standing)      OT Treatment/Interventions: Self-care/ADL training;Therapeutic exercise;Energy conservation;DME and/or AE instruction;Therapeutic activities;Patient/family education;Balance training    OT Goals(Current goals can be found in the care plan section) Acute Rehab OT Goals Patient Stated Goal: to return to work OT Goal Formulation: With patient/family Time For Goal Achievement: 09/28/21 Potential to Achieve Goals: Good ADL Goals Pt Will Perform Grooming: standing;Independently Pt Will Transfer to Toilet: Independently;ambulating;regular height toilet Additional ADL  Goal #1: Pt will Independently complete bed making task with no cues for safety  OT Frequency: Min 4X/week    Co-evaluation              AM-PAC OT "6 Clicks" Daily Activity     Outcome Measure Help from another person eating meals?: None Help from another person taking care of personal grooming?: A Little Help from another person toileting, which includes using toliet, bedpan, or urinal?: A Little Help from another person bathing (including washing, rinsing, drying)?: A Little Help from another person to put on and taking off regular upper body clothing?: None Help from another person to put on and taking off regular lower body clothing?: A Little 6 Click Score: 20   End of Session Equipment Utilized During Treatment: Gait belt Nurse Communication: Patient requests pain meds  Activity Tolerance: Patient tolerated treatment well Patient left: in chair;with call bell/phone within reach;with family/visitor present  OT Visit Diagnosis: Other abnormalities of gait and mobility (R26.89);Muscle weakness (generalized) (M62.81)                Time: [id number] OT Time Calculation (min): 15 min Charges:  OT General Charges $OT Visit: 1 Visit OT  Evaluation $OT Eval Moderate Complexity: 1 Mod  Kathie Dike, M.S. OTR/L  09/14/21, 11:41 AM  ascom [phone]

## 2021-09-14 NOTE — Progress Notes (Signed)
Inpatient Rehab Admissions Coordinator:   Per therapy recommendations,  patient was screened for CIR candidacy by Megan Salon, MS, CCC-SLP. At this time, Pt. Work up is ongoing and I am not able to make an assessment of candidacy. I will rescreen tomorrow. Please contact me any with questions.  Megan Salon, MS, CCC-SLP Rehab Admissions Coordinator  [phone] (celll) [phone] (office)

## 2021-09-14 NOTE — Progress Notes (Signed)
SLP Cancellation Note  Patient Details Name: Shawn [name] MRN: [id number] DOB: 07-24-1985   Cancelled treatment:       Reason Eval/Treat Not Completed: SLP screened, no needs identified, will sign off (chart reviewed; consulted NSG then met w/ pt/family in room.) Pt denied any difficulty swallowing and is currently on a regular diet; tolerates swallowing pills w/ water per NSG. Pt conversed in general conversation w/ family and answered few general questions w/ this Clinician w/out expressive/receptive deficits noted; pt denied any speech-language deficits. Speech clear, intelligible. Pt watching a movie on the phone during visit. No further skilled ST services indicated as pt appears at his baseline. Pt agreed. NSG to reconsult if any change in status while admitted.       Orinda Kenner, MS, CCC-SLP Speech Language Pathologist Rehab Services; [place] [phone] (ascom) Sedalia Greeson 09/14/2021, 10:28 AM

## 2021-09-14 NOTE — Progress Notes (Signed)
PROGRESS NOTE    Shawn [name]  ZOX:[id number] DOB: June 27, 1985 DOA: 09/13/2021 PCP: Patient, No Pcp Per (Inactive)  129A/129A-AA   Assessment & Plan:   Principal Problem:   Stroke-like symptom   Shawn [name] is a 36 y.o. male with medical history significant of HTN who presented with sudden onset balance issue and unable to walk.   Pt reported balance issue affecting his walk that started between 11 am-12 pm prior to presentation.  Pt said he felt the room spinning, felt dizzy, and when he tried to walk, he leaned to the right side.  Also reported having numbness in his lips and headache.  Pt came to the ED as Code Stroke.  While in the ED, pt had 2 episodes of vomiting, first was the food he ate this morning, 2nd was brown liquidly vomitus that ED provider felt was hematemesis, so TNK was not given.   # Stroke-like symptoms --per neuro, Exam findings on initial evaluation best localized as a right cerebellar lesion, but MRI brain came back negative. CTA of head and neck was showed no dissection, emergent large vessel occlusion or proximal hemodynamically significant stenosis.  Also a consideration would be basilar migraine and hypertensive emergency accompanied by focal neurological deficits.  --symptoms persisted today, with balance issues and difficulty walking Plan: --Repeat MRI brain today, which will be done w/wo contrast  --per neuro, no need for permissive HTN, goal SBP of 130-150. --ASA 81 and statin --PT/OT  # Hypertensive urgency --s/p clevidipine gtt in the ED --cont oral hydralazine 100 mg q8h (new) --add lisinopril 40 mg daily --IV hydralazine PRN   # Hematemesis, ruled out --I have examined the vomitus, which did not look or smell like blood.  No risk factors for upper GI bleed.  Per GI, occult blood testing should not be used to dx active GI bleeding since it only takes 1cc of blood to turn the test positive. --Hgb remained wnl today Plan: --no need for GI  workup   DVT prophylaxis: Lovenox SQ Code Status: Full code  Family Communication: girlfriend updated at bedside today Level of care: Telemetry Medical Dispo:   The patient is from: home Anticipated d/c is to: undetermined Anticipated d/c date is: undetermined Patient currently is not medically ready to d/c due to: still unclear etiology for pt's inability to walk   Subjective and Interval History:  Pt reported his imbalance problem is worse today.  No more N/V.  No ear pain.  Reported numbness in his right face.   Objective: Vitals:   09/14/21 0843 09/14/21 1319 09/14/21 1703 09/14/21 2014  BP: (!) 140/99 (!) 160/96 (!) 163/104 (!) 171/100  Pulse: (!) 59 (!) 58 68 68  Resp: 18 18  18   Temp: 98.3 F (36.8 C) 98.1 F (36.7 C) 98.1 F (36.7 C) 99 F (37.2 C)  TempSrc:      SpO2: 98% 99% 99% 100%  Weight:      Height:        Intake/Output Summary (Last 24 hours) at 09/14/2021 2043 Last data filed at 09/14/2021 0322 Gross per 24 hour  Intake 18.86 ml  Output --  Net 18.86 ml   Filed Weights   09/13/21 1423 09/13/21 1847  Weight: 95.3 kg 90.8 kg    Examination:   Constitutional: NAD, AAOx3 HEENT: conjunctivae and lids normal, EOMI CV: No cyanosis.   RESP: normal respiratory effort, on RA Extremities: No effusions, edema in BLE SKIN: warm, dry Psych: Normal mood and affect.  Appropriate judgement and reason   Data Reviewed: I have personally reviewed following labs and imaging studies  CBC: Recent Labs  Lab 09/13/21 1519 09/14/21 0436  WBC 9.4 9.0  NEUTROABS 7.1  --   HGB 14.5 13.0  HCT 43.6 39.0  MCV 91.4 90.5  PLT 216 216   Basic Metabolic Panel: Recent Labs  Lab 09/13/21 1519 09/14/21 0436  NA 138 141  K 3.6 3.3*  CL 103 106  CO2 26 26  GLUCOSE 99 88  BUN 19 22*  CREATININE 1.03 1.23  CALCIUM 9.2 8.9  MG  --  2.4   GFR: Estimated Creatinine Clearance: 93.9 mL/min (by C-G formula based on SCr of 1.23 mg/dL). Liver Function  Tests: Recent Labs  Lab 09/13/21 1519  AST 18  ALT 18  ALKPHOS 94  BILITOT 0.5  PROT 7.8  ALBUMIN 4.2   No results for input(s): LIPASE, AMYLASE in the last 168 hours. No results for input(s): AMMONIA in the last 168 hours. Coagulation Profile: Recent Labs  Lab 09/13/21 1519  INR 1.1   Cardiac Enzymes: No results for input(s): CKTOTAL, CKMB, CKMBINDEX, TROPONINI in the last 168 hours. BNP (last 3 results) No results for input(s): PROBNP in the last 8760 hours. HbA1C: Recent Labs    09/13/21 1519  HGBA1C 5.6   CBG: Recent Labs  Lab 09/13/21 1353  GLUCAP 97   Lipid Profile: Recent Labs    09/14/21 0436  CHOL 155  HDL 32*  LDLCALC 96  TRIG 229  CHOLHDL 4.8   Thyroid Function Tests: No results for input(s): TSH, T4TOTAL, FREET4, T3FREE, THYROIDAB in the last 72 hours. Anemia Panel: No results for input(s): VITAMINB12, FOLATE, FERRITIN, TIBC, IRON, RETICCTPCT in the last 72 hours. Sepsis Labs: No results for input(s): PROCALCITON, LATICACIDVEN in the last 168 hours.  No results found for this or any previous visit (from the past 240 hour(s)).    Radiology Studies: MR BRAIN WO CONTRAST  Result Date: 09/13/2021 CLINICAL DATA:  Neuro deficit, acute, stroke suspected EXAM: MRI HEAD WITHOUT CONTRAST TECHNIQUE: Multiplanar, multiecho pulse sequences of the brain and surrounding structures were obtained without intravenous contrast. COMPARISON:  CT head from the same day. FINDINGS: Limited stroke protocol with only DWI, FLAIR and SWI imaging. Within this limitation: Brain: No acute infarction, hemorrhage, hydrocephalus, extra-axial collection or mass lesion. Small remote lacunar infarct in the left corona radiata. Mild chronic microvascular disease. Vascular: Limited assessment without axial T2. Skull and upper cervical spine: Limited assessment without T1. No obvious marrow signal abnormality. Sinuses/Orbits: Mild paranasal sinus mucosal thickening. No obvious acute  orbital abnormality. Other: No sizable mastoid effusions. IMPRESSION: Limited stroke protocol without evidence of acute intracranial abnormality. Electronically Signed   By: Feliberto Harts M.D.   On: 09/13/2021 16:18   ECHOCARDIOGRAM COMPLETE  Result Date: 09/14/2021    ECHOCARDIOGRAM REPORT   Patient Name:   Shawn [name] Date of Exam: 09/14/2021 Medical Rec #:  [id number]      Height:       71.0 in Accession #:    [id number]     Weight:       200.2 lb Date of Birth:  16-Dec-1985      BSA:          2.109 m Patient Age:    36 years       BP:           149/99 mmHg Patient Gender: M  HR:           59 bpm. Exam Location:  Inpatient Procedure: 2D Echo, 3D Echo, Cardiac Doppler, Color Doppler and Strain Analysis Indications:     Stroke I63.9  History:         Patient has no prior history of Echocardiogram examinations.                  Risk Factors:Hypertension.  Sonographer:     Leta Jungling RDCS Referring Phys:  [id number] Inetta Fermo Abbiegail Landgren Diagnosing Phys: Yvonne Kendall MD IMPRESSIONS  1. Left ventricular ejection fraction, by estimation, is 55 to 60%. The left ventricle has normal function. The left ventricle has no regional wall motion abnormalities. There is moderate left ventricular hypertrophy. Left ventricular diastolic parameters are consistent with Grade II diastolic dysfunction (pseudonormalization).  2. Right ventricular systolic function is normal. The right ventricular size is normal. Tricuspid regurgitation signal is inadequate for assessing PA pressure.  3. The mitral valve is normal in structure. Trivial mitral valve regurgitation.  4. The aortic valve is tricuspid. Aortic valve regurgitation is not visualized. No aortic stenosis is present.  5. There is borderline dilatation of the ascending aorta, measuring 37 mm.  6. The inferior vena cava is normal in size with greater than 50% respiratory variability, suggesting right atrial pressure of 3 mmHg. FINDINGS  Left Ventricle: Left ventricular  ejection fraction, by estimation, is 55 to 60%. The left ventricle has normal function. The left ventricle has no regional wall motion abnormalities. The left ventricular internal cavity size was normal in size. There is  moderate left ventricular hypertrophy. Left ventricular diastolic parameters are consistent with Grade II diastolic dysfunction (pseudonormalization). Right Ventricle: The right ventricular size is normal. No increase in right ventricular wall thickness. Right ventricular systolic function is normal. Tricuspid regurgitation signal is inadequate for assessing PA pressure. Left Atrium: Left atrial size was normal in size. Right Atrium: Right atrial size was normal in size. Pericardium: There is no evidence of pericardial effusion. Mitral Valve: The mitral valve is normal in structure. Trivial mitral valve regurgitation. Tricuspid Valve: The tricuspid valve is normal in structure. Tricuspid valve regurgitation is trivial. Aortic Valve: The aortic valve is tricuspid. Aortic valve regurgitation is not visualized. No aortic stenosis is present. Pulmonic Valve: The pulmonic valve was normal in structure. Pulmonic valve regurgitation is mild. No evidence of pulmonic stenosis. Aorta: The aortic root is normal in size and structure. There is borderline dilatation of the ascending aorta, measuring 37 mm. Pulmonary Artery: The pulmonary artery is of normal size. Venous: The inferior vena cava is normal in size with greater than 50% respiratory variability, suggesting right atrial pressure of 3 mmHg. IAS/Shunts: No atrial level shunt detected by color flow Doppler.  LEFT VENTRICLE PLAX 2D LVIDd:         4.60 cm   Diastology LVIDs:         3.10 cm   LV e' medial:    7.51 cm/s LV PW:         1.40 cm   LV E/e' medial:  10.0 LV IVS:        1.30 cm   LV e' lateral:   7.83 cm/s LVOT diam:     2.40 cm   LV E/e' lateral: 9.6 LV SV:         88 LV SV Index:   42        2D Longitudinal Strain LVOT Area:     4.52 cm  2D  Strain GLS Avg:     -18.2 %                           3D Volume EF:                          3D EF:        59 %                          LV EDV:       189 ml                          LV ESV:       77 ml                          LV SV:        111 ml RIGHT VENTRICLE RV S prime:     12.30 cm/s TAPSE (M-mode): 2.1 cm LEFT ATRIUM             Index        RIGHT ATRIUM           Index LA diam:        3.50 cm 1.66 cm/m   RA Area:     16.80 cm LA Vol (A2C):   52.2 ml 24.75 ml/m  RA Volume:   38.50 ml  18.25 ml/m LA Vol (A4C):   40.7 ml 19.27 ml/m LA Biplane Vol: 50.4 ml 23.89 ml/m  AORTIC VALVE LVOT Vmax:   95.30 cm/s LVOT Vmean:  59.700 cm/s LVOT VTI:    0.195 m  AORTA Ao Root diam: 3.40 cm Ao Asc diam:  3.70 cm MITRAL VALVE MV Area (PHT): 3.36 cm    SHUNTS MV Decel Time: 226 msec    Systemic VTI:  0.20 m MV E velocity: 75.00 cm/s  Systemic Diam: 2.40 cm MV A velocity: 35.10 cm/s MV E/A ratio:  2.14 Cristal Deer End MD Electronically signed by Yvonne Kendall MD Signature Date/Time: 09/14/2021/12:07:31 PM    Final    CT HEAD CODE STROKE WO CONTRAST  Result Date: 09/13/2021 CLINICAL DATA:  Code stroke.  Neuro deficit, acute, stroke suspected EXAM: CT HEAD WITHOUT CONTRAST TECHNIQUE: Contiguous axial images were obtained from the base of the skull through the vertex without intravenous contrast. RADIATION DOSE REDUCTION: This exam was performed according to the departmental dose-optimization program which includes automated exposure control, adjustment of the mA and/or kV according to patient size and/or use of iterative reconstruction technique. COMPARISON:  None Available. FINDINGS: Brain: No evidence of acute large vascular territory infarction, hemorrhage, hydrocephalus, extra-axial collection or mass lesion/mass effect. Vascular: No hyperdense vessel identified. Skull: No acute fracture. Sinuses/Orbits: Clear sinuses. Other: No mastoid effusions. ASPECTS [facility] Stroke Program Early CT Score) total score (0-10  with 10 being normal): 10. Provider paged for call report at the time of dictation. IMPRESSION: No evidence of acute intracranial abnormality. ASPECTS is 10. Electronically Signed   By: Feliberto Harts M.D.   On: 09/13/2021 14:21   CT ANGIO HEAD NECK W WO CM (CODE STROKE)  Result Date: 09/13/2021 CLINICAL DATA:  Neuro deficit, acute, stroke suspected EXAM: CT ANGIOGRAPHY HEAD AND NECK TECHNIQUE: Multidetector CT imaging of the head and neck was performed using the standard protocol during bolus administration of  intravenous contrast. Multiplanar CT image reconstructions and MIPs were obtained to evaluate the vascular anatomy. Carotid stenosis measurements (when applicable) are obtained utilizing NASCET criteria, using the distal internal carotid diameter as the denominator. RADIATION DOSE REDUCTION: This exam was performed according to the departmental dose-optimization program which includes automated exposure control, adjustment of the mA and/or kV according to patient size and/or use of iterative reconstruction technique. CONTRAST:  68mL OMNIPAQUE IOHEXOL 350 MG/ML SOLN COMPARISON:  None Available. FINDINGS: CTA NECK FINDINGS Aortic arch: Great vessel origins are patent without significant stenosis. Right carotid system: No evidence of dissection, stenosis (50% or greater) or occlusion. Left carotid system: No evidence of dissection, stenosis (50% or greater) or occlusion. Vertebral arteries: Right dominant. No evidence of dissection, stenosis (50% or greater) or occlusion. Skeleton: No acute abnormality. Other neck: No acute abnormality. Upper chest: Visualized lung apices are clear. Review of the MIP images confirms the above findings CTA HEAD FINDINGS Evaluation limited by venous contrast bolus timing. Anterior circulation: Bilateral intracranial ICAs, MCAs, and ACAs are patent without proximal hemodynamically significant stenosis. Posterior circulation: Both intradural vertebral arteries are patent with  mild left intradural vertebral artery stenosis. The basilar artery and bilateral posterior cerebral arteries are patent without proximal hemodynamically significant stenosis. Small right P1 PCA with prominent right posterior communicating artery, anatomic variant. No evidence of proximal hemodynamically significant stenosis. No aneurysm identified. Venous sinuses: As permitted by contrast timing, patent. Review of the MIP images confirms the above findings IMPRESSION: No emergent large vessel occlusion or proximal hemodynamically significant stenosis. Findings discussed with Dr. Otelia Limes via telephone at 2:45 p.m. Electronically Signed   By: Feliberto Harts M.D.   On: 09/13/2021 15:04     Scheduled Meds:  aspirin EC  81 mg Oral Daily   enoxaparin (LOVENOX) injection  40 mg Subcutaneous Q24H   hydrALAZINE  100 mg Oral Q8H   Continuous Infusions:   LOS: 0 days     Darlin Priestly, MD [facility] If 7PM-7AM, please contact night-coverage 09/14/2021, 8:43 PM

## 2021-09-14 NOTE — Progress Notes (Signed)
Subjective: Symptoms unchanged today. Had a brief episode of horizontal nystagmus witnessed by family. Having intermittent double vision.   Objective: Current vital signs: BP (!) 128/93 (BP Location: Left Arm)   Pulse 63   Temp 98.1 F (36.7 C) (Oral)   Resp 18   Ht 5\' 11"  (1.803 m)   Wt 90.8 kg   SpO2 97%   BMI 27.92 kg/m  Vital signs in last 24 hours: Temp:  [97.8 F (36.6 C)-98.8 F (37.1 C)] 98.1 F (36.7 C) (05/30 0439) Pulse Rate:  [60-101] 63 (05/30 0439) Resp:  [12-23] 18 (05/30 0439) BP: (127-203)/(91-147) 128/93 (05/30 0439) SpO2:  [95 %-100 %] 97 % (05/30 0439) Weight:  [90.8 kg-95.3 kg] 90.8 kg (05/29 1847)  Intake/Output from previous day: 05/29 0701 - 05/30 0700 In: 18.9 [I.V.:18.9] Out: -  Intake/Output this shift: No intake/output data recorded. Nutritional status:  Diet Order             Diet regular Room service appropriate? Yes; Fluid consistency: Thin  Diet effective now                  Physical Exam  HEENT-  [place]/AT  Lungs- Respirations unlabored Extremities- No edema   Neurological Examination Mental Status: Awake, alert and oriented. Speech fluent with intact comprehension and naming.  Cranial Nerves: II: Fixates and tracks normally. PERRL.   III,IV, VI: No ptosis. EOMI with slow-beating horizontal nystagmus on far left horizontal gaze that does not fatigue (new since yesterday). No nystagmus with central gaze, upgaze and right lateral gaze.  V: Temp sensation decreased on the right (new since yesterday)  VII: Smile symmetric  VIII: Hearing intact to voice IX,X: Palate elevates symmetrically XI: Symmetric shoulder shrug in the context of leaning to the right while sitting XII: Midline tongue extension Motor: RUE 5/5 proximally and distally RLE 5/5 proximally and distally LUE 5/5 proximally and distally LLE 5/5 proximally and distally No pronator drift Sensory: Temp and light touch sensation intact throughout, bilaterally. No  extinction to DSS.  Deep Tendon Reflexes: 1+ bilateral brachioradialis. 2+ left patella, 1+ right patella.   Cerebellar: Dysmetria with FNF on the right. Normal FNF on the left. RAM is without definite deficit bilaterally. H-S without ataxia bilaterally.  Gait: Significantly unsteady gait with leaning and occasional initiation of falls to the right; requiring support by examiner. Leans to the right when sitting.     Lab Results: Results for orders placed or performed during the hospital encounter of 09/13/21 (from the past 48 hour(s))  CBG monitoring, ED     Status: None   Collection Time: 09/13/21  1:53 PM  Result Value Ref Range   Glucose-Capillary 97 70 - 99 mg/dL    Comment: Glucose reference range applies only to samples taken after fasting for at least 8 hours.  Protime-INR     Status: None   Collection Time: 09/13/21  3:19 PM  Result Value Ref Range   Prothrombin Time 14.5 11.4 - 15.2 seconds   INR 1.1 0.8 - 1.2    Comment: (NOTE) INR goal varies based on device and disease states. Performed at [facility], [address] [place], [place] [address]   APTT     Status: None   Collection Time: 09/13/21  3:19 PM  Result Value Ref Range   aPTT 34 24 - 36 seconds    Comment: Performed at [facility], [address] [place], [place] [address]  CBC     Status:  None   Collection Time: 09/13/21  3:19 PM  Result Value Ref Range   WBC 9.4 4.0 - 10.5 K/uL   RBC 4.77 4.22 - 5.81 MIL/uL   Hemoglobin 14.5 13.0 - 17.0 g/dL   HCT 43.6 39.0 - 52.0 %   MCV 91.4 80.0 - 100.0 fL   MCH 30.4 26.0 - 34.0 pg   MCHC 33.3 30.0 - 36.0 g/dL   RDW 13.7 11.5 - 15.5 %   Platelets 216 150 - 400 K/uL   nRBC 0.0 0.0 - 0.2 %    Comment: Performed at [facility], [address] [place], [place] [address]  Differential     Status: None   Collection Time: 09/13/21  3:19 PM  Result Value Ref Range   Neutrophils Relative % 77 %   Neutro Abs 7.1 1.7 - 7.7 K/uL    Lymphocytes Relative 13 %   Lymphs Abs 1.3 0.7 - 4.0 K/uL   Monocytes Relative 9 %   Monocytes Absolute 0.9 0.1 - 1.0 K/uL   Eosinophils Relative 1 %   Eosinophils Absolute 0.1 0.0 - 0.5 K/uL   Basophils Relative 0 %   Basophils Absolute 0.0 0.0 - 0.1 K/uL   Immature Granulocytes 0 %   Abs Immature Granulocytes 0.04 0.00 - 0.07 K/uL    Comment: Performed at [facility], [address] [place], [place] [address]  Comprehensive metabolic panel     Status: None   Collection Time: 09/13/21  3:19 PM  Result Value Ref Range   Sodium 138 135 - 145 mmol/L   Potassium 3.6 3.5 - 5.1 mmol/L   Chloride 103 98 - 111 mmol/L   CO2 26 22 - 32 mmol/L   Glucose, Bld 99 70 - 99 mg/dL    Comment: Glucose reference range applies only to samples taken after fasting for at least 8 hours.   BUN 19 6 - 20 mg/dL   Creatinine, Ser 1.03 0.61 - 1.24 mg/dL   Calcium 9.2 8.9 - 10.3 mg/dL   Total Protein 7.8 6.5 - 8.1 g/dL   Albumin 4.2 3.5 - 5.0 g/dL   AST 18 15 - 41 U/L   ALT 18 0 - 44 U/L   Alkaline Phosphatase 94 38 - 126 U/L   Total Bilirubin 0.5 0.3 - 1.2 mg/dL   GFR, Estimated >60 >60 mL/min    Comment: (NOTE) Calculated using the CKD-EPI Creatinine Equation (2021)    Anion gap 9 5 - 15    Comment: Performed at [facility], [address] [place], [place] [address]  Hemoglobin A1c     Status: None   Collection Time: 09/13/21  3:19 PM  Result Value Ref Range   Hgb A1c MFr Bld 5.6 4.8 - 5.6 %    Comment: (NOTE) Pre diabetes:          5.7%-6.4%  Diabetes:              >6.4%  Glycemic control for   <7.0% adults with diabetes    Mean Plasma Glucose 114.02 mg/dL    Comment: Performed at [facility] [address], [place], [place] [id number]  Basic metabolic panel     Status: Abnormal   Collection Time: 09/14/21  4:36 AM  Result Value Ref Range   Sodium 141 135 - 145 mmol/L   Potassium 3.3 (L) 3.5 - 5.1 mmol/L   Chloride 106 98 - 111 mmol/L   CO2 26 22 - 32  mmol/L   Glucose, Bld 88 70 - 99 mg/dL    Comment: Glucose reference range applies only to samples taken after fasting for at least 8 hours.   BUN 22 (H) 6 - 20 mg/dL   Creatinine, Ser 5.70 0.61 - 1.24 mg/dL   Calcium 8.9 8.9 - 17.7 mg/dL   GFR, Estimated >93 >90 mL/min    Comment: (NOTE) Calculated using the CKD-EPI Creatinine Equation (2021)    Anion gap 9 5 - 15    Comment: Performed at [facility], [address] [place], [place] [address]  CBC     Status: None   Collection Time: 09/14/21  4:36 AM  Result Value Ref Range   WBC 9.0 4.0 - 10.5 K/uL   RBC 4.31 4.22 - 5.81 MIL/uL   Hemoglobin 13.0 13.0 - 17.0 g/dL   HCT 33.0 07.6 - 22.6 %   MCV 90.5 80.0 - 100.0 fL   MCH 30.2 26.0 - 34.0 pg   MCHC 33.3 30.0 - 36.0 g/dL   RDW 33.3 54.5 - 62.5 %   Platelets 216 150 - 400 K/uL   nRBC 0.0 0.0 - 0.2 %    Comment: Performed at [facility], [address] [place], [place] [address]  Magnesium     Status: None   Collection Time: 09/14/21  4:36 AM  Result Value Ref Range   Magnesium 2.4 1.7 - 2.4 mg/dL    Comment: Performed at [facility], [address] [place], [place] [address]  Lipid panel     Status: Abnormal   Collection Time: 09/14/21  4:36 AM  Result Value Ref Range   Cholesterol 155 0 - 200 mg/dL   Triglycerides 768 <115 mg/dL   HDL 32 (L) >72 mg/dL   Total CHOL/HDL Ratio 4.8 RATIO   VLDL 27 0 - 40 mg/dL   LDL Cholesterol 96 0 - 99 mg/dL    Comment:        Total Cholesterol/HDL:CHD Risk Coronary Heart Disease Risk Table                     Men   Women  1/2 Average Risk   3.4   3.3  Average Risk       5.0   4.4  2 X Average Risk   9.6   7.1  3 X Average Risk  23.4   11.0        Use the calculated Patient Ratio above and the CHD Risk Table to determine the patient's CHD Risk.        ATP III CLASSIFICATION (LDL):  <100     mg/dL   Optimal  620-355  mg/dL   Near or Above                    Optimal  130-159  mg/dL   Borderline   974-163  mg/dL   High  >845     mg/dL   Very High Performed at [facility], [address] [place], [place] [address]     No results found for this or any previous visit (from the past 240 hour(s)).  Lipid Panel Recent Labs    09/14/21 0436  CHOL 155  TRIG 135  HDL 32*  CHOLHDL 4.8  VLDL 27  LDLCALC 96    Studies/Results: MR BRAIN WO CONTRAST  Result Date: 09/13/2021 CLINICAL DATA:  Neuro deficit, acute, stroke suspected EXAM: MRI HEAD WITHOUT CONTRAST TECHNIQUE: Multiplanar,  multiecho pulse sequences of the brain and surrounding structures were obtained without intravenous contrast. COMPARISON:  CT head from the same day. FINDINGS: Limited stroke protocol with only DWI, FLAIR and SWI imaging. Within this limitation: Brain: No acute infarction, hemorrhage, hydrocephalus, extra-axial collection or mass lesion. Small remote lacunar infarct in the left corona radiata. Mild chronic microvascular disease. Vascular: Limited assessment without axial T2. Skull and upper cervical spine: Limited assessment without T1. No obvious marrow signal abnormality. Sinuses/Orbits: Mild paranasal sinus mucosal thickening. No obvious acute orbital abnormality. Other: No sizable mastoid effusions. IMPRESSION: Limited stroke protocol without evidence of acute intracranial abnormality. Electronically Signed   By: Feliberto Harts M.D.   On: 09/13/2021 16:18   CT HEAD CODE STROKE WO CONTRAST  Result Date: 09/13/2021 CLINICAL DATA:  Code stroke.  Neuro deficit, acute, stroke suspected EXAM: CT HEAD WITHOUT CONTRAST TECHNIQUE: Contiguous axial images were obtained from the base of the skull through the vertex without intravenous contrast. RADIATION DOSE REDUCTION: This exam was performed according to the departmental dose-optimization program which includes automated exposure control, adjustment of the mA and/or kV according to patient size and/or use of iterative reconstruction technique. COMPARISON:   None Available. FINDINGS: Brain: No evidence of acute large vascular territory infarction, hemorrhage, hydrocephalus, extra-axial collection or mass lesion/mass effect. Vascular: No hyperdense vessel identified. Skull: No acute fracture. Sinuses/Orbits: Clear sinuses. Other: No mastoid effusions. ASPECTS [facility] Stroke Program Early CT Score) total score (0-10 with 10 being normal): 10. Provider paged for call report at the time of dictation. IMPRESSION: No evidence of acute intracranial abnormality. ASPECTS is 10. Electronically Signed   By: Feliberto Harts M.D.   On: 09/13/2021 14:21   CT ANGIO HEAD NECK W WO CM (CODE STROKE)  Result Date: 09/13/2021 CLINICAL DATA:  Neuro deficit, acute, stroke suspected EXAM: CT ANGIOGRAPHY HEAD AND NECK TECHNIQUE: Multidetector CT imaging of the head and neck was performed using the standard protocol during bolus administration of intravenous contrast. Multiplanar CT image reconstructions and MIPs were obtained to evaluate the vascular anatomy. Carotid stenosis measurements (when applicable) are obtained utilizing NASCET criteria, using the distal internal carotid diameter as the denominator. RADIATION DOSE REDUCTION: This exam was performed according to the departmental dose-optimization program which includes automated exposure control, adjustment of the mA and/or kV according to patient size and/or use of iterative reconstruction technique. CONTRAST:  32mL OMNIPAQUE IOHEXOL 350 MG/ML SOLN COMPARISON:  None Available. FINDINGS: CTA NECK FINDINGS Aortic arch: Great vessel origins are patent without significant stenosis. Right carotid system: No evidence of dissection, stenosis (50% or greater) or occlusion. Left carotid system: No evidence of dissection, stenosis (50% or greater) or occlusion. Vertebral arteries: Right dominant. No evidence of dissection, stenosis (50% or greater) or occlusion. Skeleton: No acute abnormality. Other neck: No acute abnormality. Upper  chest: Visualized lung apices are clear. Review of the MIP images confirms the above findings CTA HEAD FINDINGS Evaluation limited by venous contrast bolus timing. Anterior circulation: Bilateral intracranial ICAs, MCAs, and ACAs are patent without proximal hemodynamically significant stenosis. Posterior circulation: Both intradural vertebral arteries are patent with mild left intradural vertebral artery stenosis. The basilar artery and bilateral posterior cerebral arteries are patent without proximal hemodynamically significant stenosis. Small right P1 PCA with prominent right posterior communicating artery, anatomic variant. No evidence of proximal hemodynamically significant stenosis. No aneurysm identified. Venous sinuses: As permitted by contrast timing, patent. Review of the MIP images confirms the above findings IMPRESSION: No emergent large vessel occlusion or proximal hemodynamically significant  stenosis. Findings discussed with Dr. Cheral Marker via telephone at 2:45 p.m. Electronically Signed   By: Margaretha Sheffield M.D.   On: 09/13/2021 15:04    Medications: Scheduled:  enoxaparin (LOVENOX) injection  40 mg Subcutaneous Q24H   hydrALAZINE  100 mg Oral Q8H   potassium chloride  40 mEq Oral Once   TTE:  1. Left ventricular ejection fraction, by estimation, is 55 to 60%. The  left ventricle has normal function. The left ventricle has no regional  wall motion abnormalities. There is moderate left ventricular hypertrophy.  Left ventricular diastolic parameters are consistent with Grade II diastolic  dysfunction (pseudonormalization).   2. Right ventricular systolic function is normal. The right ventricular  size is normal. Tricuspid regurgitation signal is inadequate for assessing  PA pressure.   3. The mitral valve is normal in structure. Trivial mitral valve  regurgitation.   4. The aortic valve is tricuspid. Aortic valve regurgitation is not  visualized. No aortic stenosis is present.   5.  There is borderline dilatation of the ascending aorta, measuring 37  mm.   6. The inferior vena cava is normal in size with greater than 50%  respiratory variability, suggesting right atrial pressure of 3 mmHg.   Assessment: 36 year old male presenting on Monday with acute onset of dizziness and gait instability with veering to the right, followed by severe nausea with hematemesis in CT.  1. Exam findings on initial evaluation best localized as a right cerebellar lesion, but MRI brain came back negative. CTA of head and neck was showed no dissection, emergent large vessel occlusion or proximal hemodynamically significant stenosis. Examination today similar to yesterday but also with some new findings as described in the documented exam above.  2. Given his young age and posterior neck and head pain near the time of symptom onset, in conjunction with negative imaging studies, basilar migraine and hypertensive emergency accompanied by focal neurological deficits are now the most likely components of the DDx. MRI-negative posterior fossa stroke is less likely, but also on the DDx. Multiple sclerosis is also a consideration.  3. MRI brain did reveal a chronic periventricular white matter microinfarction in addition to early changes most consistent with chronic small vessel disease. These are imaging markers typical of long-standing uncontrolled HTN.  4. EKG with sinus rhythm.  5. TTE shows findings suggestive of chronic HTN.     Recommendations: 1. Frequent neuro checks 2. BP management with goal SBP of 130-150. Out of the permissive HTN time window.  3. HgbA1c, fasting lipid panel 4. Cardiac telemetry 5. ASA 81 mg po qd 6. PT/OT/Speech 7. Statin. 8. Most likely will need to be started on a scheduled PO antihypertensive prior to discharge. 9. Home BP monitoring on a daily basis after discharge.  10. Repeat MRI brain today, which will be done w/wo contrast (ordered)      LOS: 0 days    @Electronically  signed: Dr. Kerney Elbe 09/14/2021  8:17 AM

## 2021-09-14 NOTE — Evaluation (Signed)
Physical Therapy Evaluation Patient Details Name: Shawn [name] MRN: [id number] DOB: 1985/10/05 Today's Date: 09/14/2021  History of Present Illness  Shawn [name] is a 36 y.o. male with medical history significant of HTN who presented with sudden onset balance issue, lip numbness, headacheand, and unable to walk.   Clinical Impression  Pt alert, walking with OT at start of session. Pt returned to sitting with minA. Per pt/family/OT, pt at baseline independent, works two jobs. The patient endorsed intermittent blurry vision, some R ear pain prior to admission, and family referenced he was using q tips due to R ear irritation. No nystagmus noted at rest, some notable at end ranges of vision, unclear if this is an acute change. Attempted VOR testing, appeared normal with questionable results. The patient required minA for sit <> Stand. R lateral lean noted throughout requiring min-modA while ambulating. Attempted with and without RW, but displayed decreased motor coordination, difficulty with L leg stance, and endorsed R leg weakness. Pt did become emotional due to frustration at end of session.  Overall the patient demonstrated deficits (see "PT Problem List") that impede the patient's functional abilities, safety, and mobility and would benefit from skilled PT intervention. Recommendation is inpatient rehab to maximize function, safety, and mobility.        Recommendations for follow up therapy are one component of a multi-disciplinary discharge planning process, led by the attending physician.  Recommendations may be updated based on patient status, additional functional criteria and insurance authorization.  Follow Up Recommendations Acute inpatient rehab (3hours/day)    Assistance Recommended at Discharge Frequent or constant Supervision/Assistance  Patient can return home with the following  A lot of help with walking and/or transfers;Assist for transportation;A lot of help with  bathing/dressing/bathroom;Assistance with cooking/housework;Help with stairs or ramp for entrance    Equipment Recommendations Rolling walker (2 wheels)  Recommendations for Other Services       Functional Status Assessment Patient has had a recent decline in their functional status and demonstrates the ability to make significant improvements in function in a reasonable and predictable amount of time.     Precautions / Restrictions Precautions Precautions: Fall Restrictions Weight Bearing Restrictions: No      Mobility  Bed Mobility Overal bed mobility: Modified Independent             General bed mobility comments: increased time    Transfers Overall transfer level: Needs assistance Equipment used: None Transfers: Sit to/from Stand Sit to Stand: Min assist                Ambulation/Gait   Gait Distance (Feet): 50 Feet Assistive device: Rolling walker (2 wheels), None         General Gait Details: R lateral lean throughout, difficulty with gross motor coordination pt reported feeling like his R leg was buckling. decreased stance time on L  Stairs            Wheelchair Mobility    Modified Rankin (Stroke Patients Only)       Balance Overall balance assessment: Needs assistance Sitting-balance support: No upper extremity supported, Feet supported Sitting balance-Leahy Scale: Good     Standing balance support: During functional activity, No upper extremity supported Standing balance-Leahy Scale: Poor                               Pertinent Vitals/Pain Pain Assessment Pain Assessment: 0-10 Pain Score: 5  Pain Location:  headache Pain Descriptors / Indicators: Headache Pain Intervention(s): Repositioned, Monitored during session    [place] expects to be discharged to:: Private residence Living Arrangements: Other relatives (brother) Available Help at Discharge: Family Type of Home: House Home Access:  Stairs to enter Entrance Stairs-Rails: Can reach both Entrance Stairs-Number of Steps: 3   Home Layout: One level Home Equipment: None      Prior Function Prior Level of Function : Independent/Modified Independent;Working/employed;Driving               ADLs Comments: drives a forklift for work, works 2 jobs     Journalist, newspaper   Dominant Hand: Right    Extremity/Trunk Assessment   Upper Extremity Assessment Upper Extremity Assessment: Overall WFL for tasks assessed    Lower Extremity Assessment Lower Extremity Assessment: Defer to PT evaluation       Communication   Communication: Expressive difficulties  Cognition Arousal/Alertness: Awake/alert Behavior During Therapy: Flat affect Overall Cognitive Status: Impaired/Different from baseline Area of Impairment: Safety/judgement                         Safety/Judgement: Decreased awareness of deficits              General Comments General comments (skin integrity, edema, etc.): Reclined in chair: BP 160/104, MPA 119, HR 61    Exercises     Assessment/Plan    PT Assessment Patient needs continued PT services  PT Problem List Decreased strength;Decreased mobility;Decreased activity tolerance;Decreased balance;Decreased knowledge of use of DME       PT Treatment Interventions Therapeutic exercise;Gait training;DME instruction;Balance training;Stair training;Neuromuscular re-education;Functional mobility training;Therapeutic activities;Patient/family education    PT Goals (Current goals can be found in the Care Plan section)  Acute Rehab PT Goals Patient Stated Goal: to feel better, get some answers PT Goal Formulation: With patient Time For Goal Achievement: 09/28/21 Potential to Achieve Goals: Good    Frequency 7X/week     Co-evaluation               AM-PAC PT "6 Clicks" Mobility  Outcome Measure Help needed turning from your back to your side while in a flat bed without using  bedrails?: A Little Help needed moving from lying on your back to sitting on the side of a flat bed without using bedrails?: A Little Help needed moving to and from a bed to a chair (including a wheelchair)?: A Little Help needed standing up from a chair using your arms (e.g., wheelchair or bedside chair)?: A Little Help needed to walk in hospital room?: A Lot Help needed climbing 3-5 steps with a railing? : A Lot 6 Click Score: 16    End of Session Equipment Utilized During Treatment: Gait belt Activity Tolerance: Patient tolerated treatment well Patient left: in chair;with call bell/phone within reach Nurse Communication: Mobility status PT Visit Diagnosis: Other abnormalities of gait and mobility (R26.89);Difficulty in walking, not elsewhere classified (R26.2);Muscle weakness (generalized) (M62.81)    Time: [id number] PT Time Calculation (min) (ACUTE ONLY): 14 min   Charges:   PT Evaluation $PT Eval Low Complexity: 1 Low         Lieutenant Diego PT, DPT 12:30 PM,09/14/21

## 2021-09-15 ENCOUNTER — Encounter: Payer: Self-pay | Admitting: Hospitalist

## 2021-09-15 ENCOUNTER — Inpatient Hospital Stay: Payer: 59

## 2021-09-15 ENCOUNTER — Other Ambulatory Visit: Payer: Self-pay

## 2021-09-15 DIAGNOSIS — I639 Cerebral infarction, unspecified: Secondary | ICD-10-CM

## 2021-09-15 DIAGNOSIS — I10 Essential (primary) hypertension: Secondary | ICD-10-CM

## 2021-09-15 LAB — CBC
HCT: 41.7 % (ref 39.0–52.0)
Hemoglobin: 13.9 g/dL (ref 13.0–17.0)
MCH: 30.6 pg (ref 26.0–34.0)
MCHC: 33.3 g/dL (ref 30.0–36.0)
MCV: 91.9 fL (ref 80.0–100.0)
Platelets: 224 10*3/uL (ref 150–400)
RBC: 4.54 MIL/uL (ref 4.22–5.81)
RDW: 14.3 % (ref 11.5–15.5)
WBC: 7.3 10*3/uL (ref 4.0–10.5)
nRBC: 0 % (ref 0.0–0.2)

## 2021-09-15 LAB — MAGNESIUM: Magnesium: 2.4 mg/dL (ref 1.7–2.4)

## 2021-09-15 LAB — BASIC METABOLIC PANEL
Anion gap: 7 (ref 5–15)
BUN: 18 mg/dL (ref 6–20)
CO2: 27 mmol/L (ref 22–32)
Calcium: 9.1 mg/dL (ref 8.9–10.3)
Chloride: 105 mmol/L (ref 98–111)
Creatinine, Ser: 1.12 mg/dL (ref 0.61–1.24)
GFR, Estimated: >60 mL/min (ref >60)
Glucose, Bld: 100 mg/dL — ABNORMAL HIGH (ref 70–99)
Potassium: 3.9 mmol/L (ref 3.5–5.1)
Sodium: 139 mmol/L (ref 135–145)

## 2021-09-15 IMAGING — MR MR HEAD WO/W CM
15 series · 47 of 48 positions shown · IV contrast (gadavist)
Comparison: Multiple previous exams from [DATE].

CLINICAL DATA: Follow-up examination for stroke.

EXAM:
MRI HEAD WITHOUT AND WITH CONTRAST
TECHNIQUE: Multiplanar, multiecho pulse sequences of the brain and surrounding
structures were obtained without and with intravenous contrast.
CONTRAST:  10mL GADAVIST GADOBUTROL 1 MMOL/ML IV SOLN

[Series 5: ax dwi_tracew · axial · 3.0mm · 0.65mm/px · z∈[-128,+23]mm · 4 of 48 slices shown]
[im 1/48]
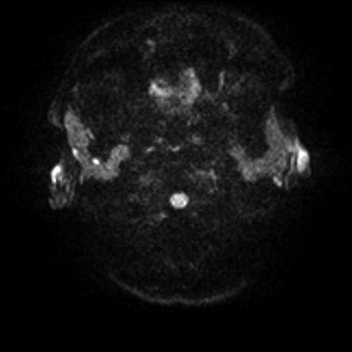
[im 16/48]
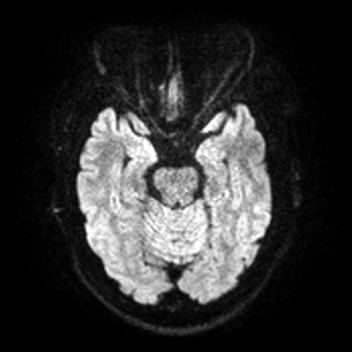
[im 32/48]
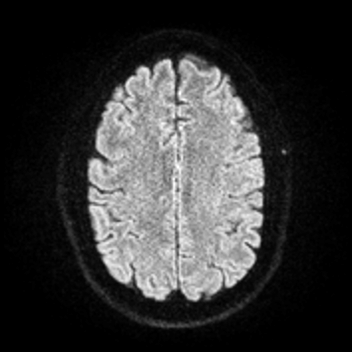
[im 48/48]
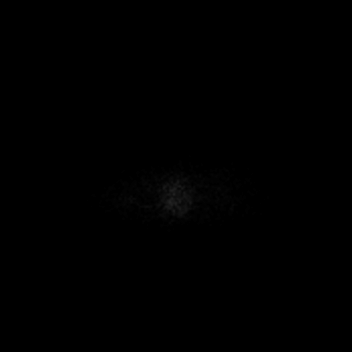

[Series 6: ax dwi_adc · axial · 3.0mm · 0.65mm/px · z∈[-128,+20]mm · 3 of 47 slices shown]
[im 1/47]
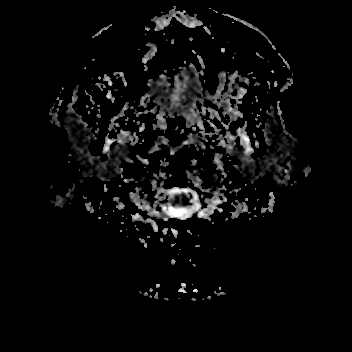
[im 24/47]
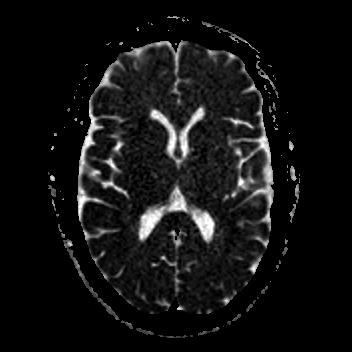
[im 47/47]
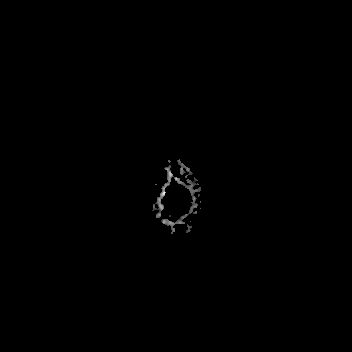

[Series 7: cor dwi_tracew · coronal · 5.0mm · 0.65mm/px · 2 of 40 slices shown]
[im 1/40]
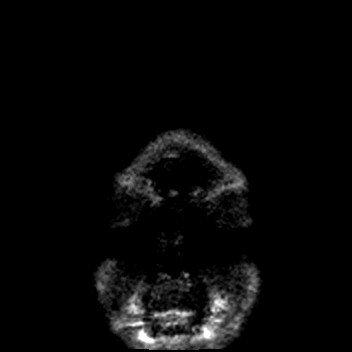
[im 40/40]
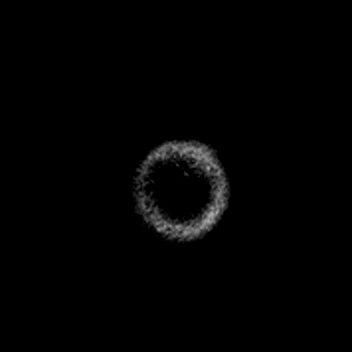

[Series 8: cor dwi_adc · coronal · 5.0mm · 0.65mm/px · 2 of 40 slices shown]
[im 1/40]
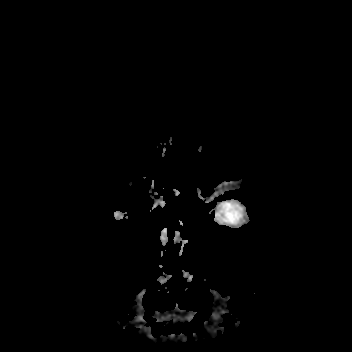
[im 40/40]
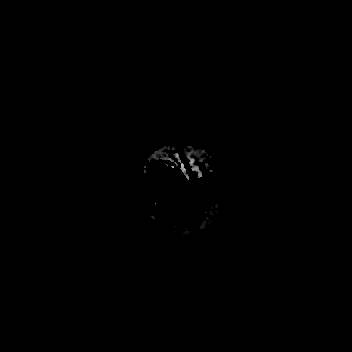

[Series 9: T1 · sagittal · 5.0mm · 0.62mm/px · 1 of 25 slices shown (1 of 2)]
[im 1/25]
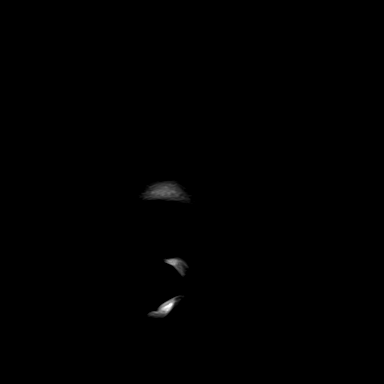

[Series 10: FLAIR · sagittal · 5.0mm · 0.94mm/px · 1 of 24 slices shown (1 of 2)]
[im 1/24]
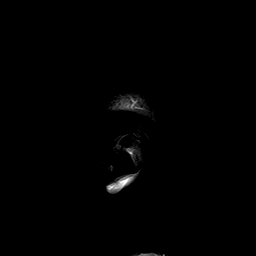

[Series 15: T2 · axial · 5.0mm · 0.53mm/px · 1 of 25 slices shown]
[im 1/25]
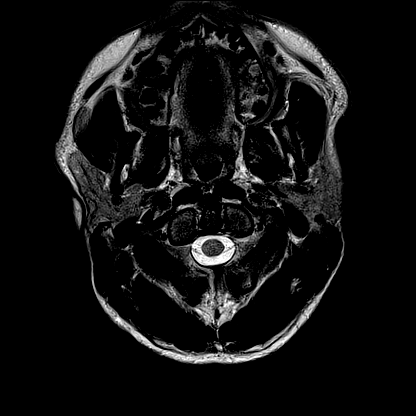

[Series 17: pha_images · axial · 3.0mm · 0.90mm/px · z∈[-139,+33]mm · 3 of 60 slices shown]
[im 1/60]
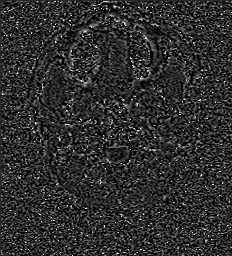
[im 30/60]
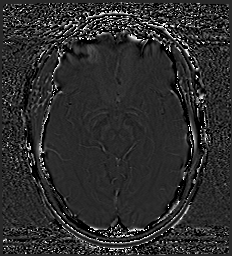
[im 60/60]
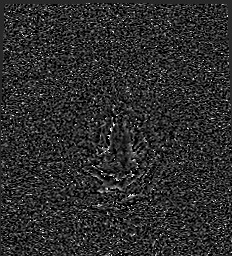

[Series 18: swi_images · axial · 3.0mm · 0.90mm/px · z∈[-139,+33]mm · 3 of 60 slices shown]
[im 1/60]
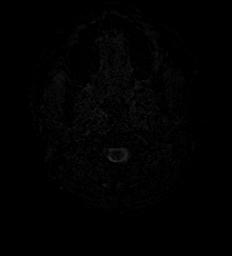
[im 30/60]
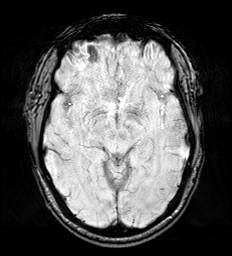
[im 60/60]
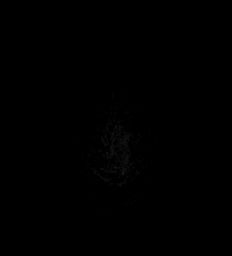

[Series 20: FLAIR · axial · 3.0mm · 0.53mm/px · z∈[-116,+42]mm · 3 of 55 slices shown (2 of 2)]
[im 1/55]
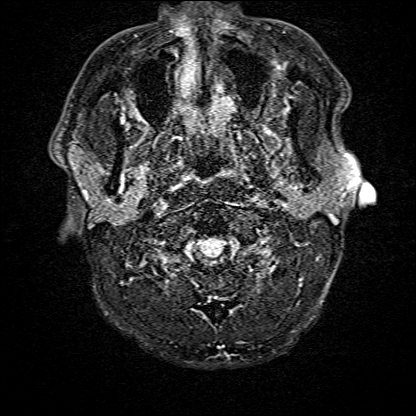
[im 28/55]
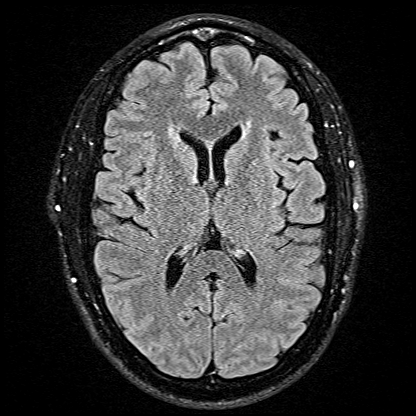
[im 55/55]
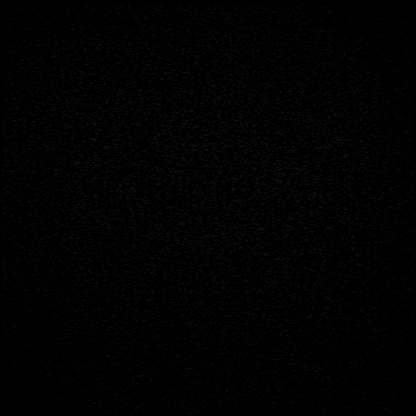

[Series 21: T1 · axial · 1.0mm · 0.98mm/px · z∈[-132,+36]mm · 9 of 176 slices shown (2 of 2)]
[im 1/176]
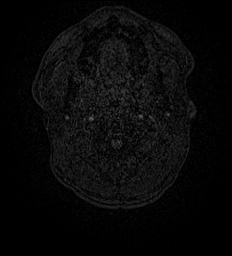
[im 20/176]
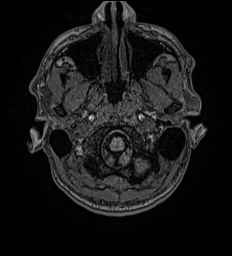
[im 39/176]
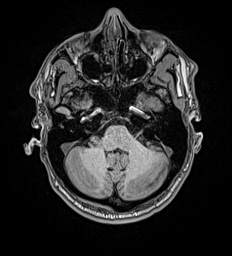
[im 59/176]
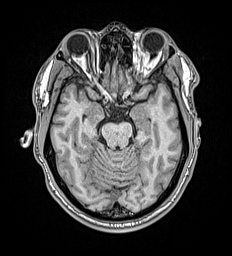
[im 78/176]
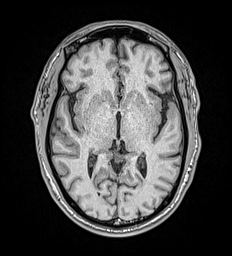
[im 98/176]
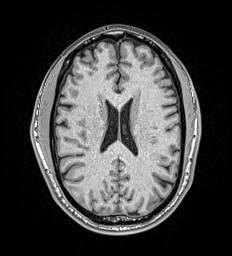
[im 117/176]
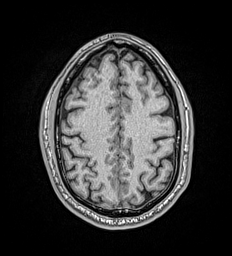
[im 156/176]
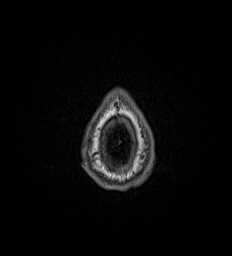
[im 176/176]
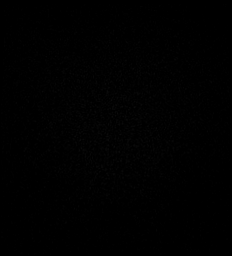

[Series 22: T2 post-contrast · coronal · 5.0mm · 0.57mm/px · 2 of 29 slices shown]
[im 1/29]
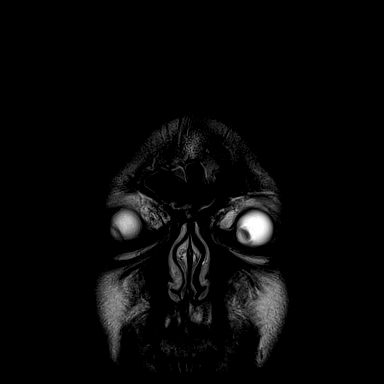
[im 29/29]
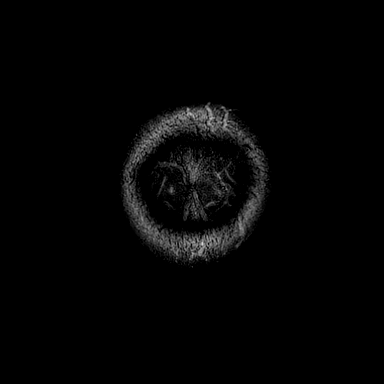

[Series 23: T1 post-contrast · axial · 1.0mm · 0.98mm/px · z∈[-132,+36]mm · 10 of 176 slices shown (1 of 3)]
[im 1/176]
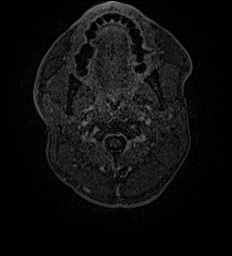
[im 20/176]
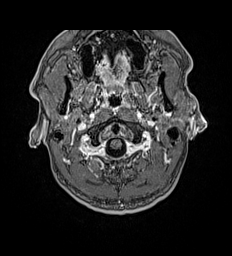
[im 39/176]
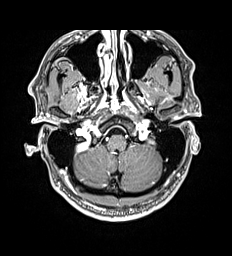
[im 59/176]
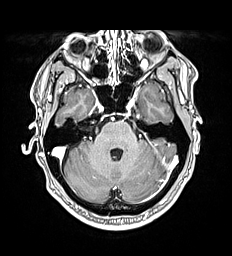
[im 78/176]
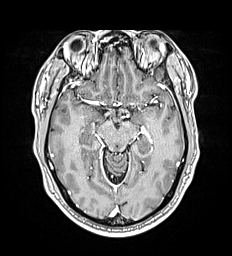
[im 98/176]
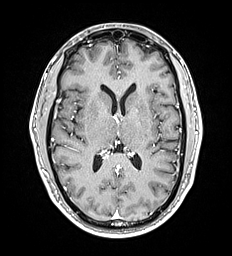
[im 117/176]
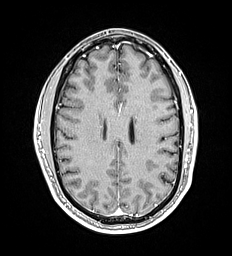
[im 137/176]
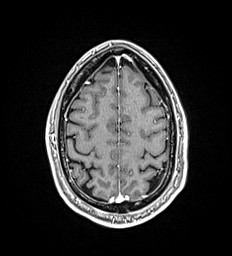
[im 156/176]
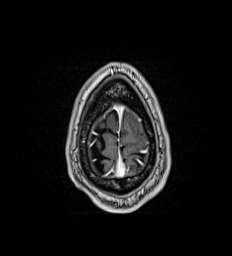
[im 176/176]
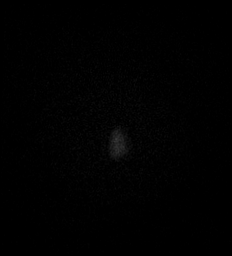

[Series 24: T1 post-contrast · coronal · 5.0mm · 0.57mm/px · 2 of 29 slices shown (2 of 3)]
[im 1/29]
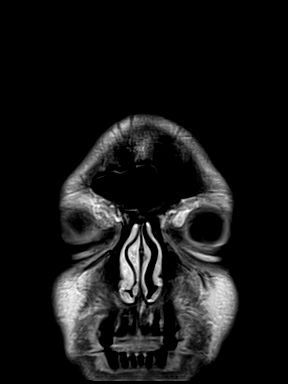
[im 29/29]
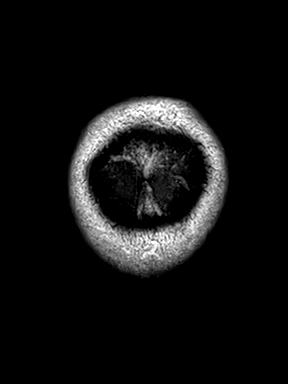

[Series 25: T1 post-contrast · sagittal · 5.0mm · 0.62mm/px · 1 of 25 slices shown (3 of 3)]
[im 1/25]
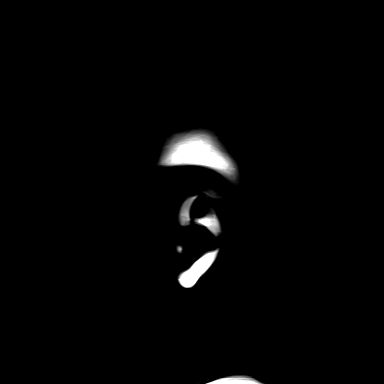

[47 of 48 positions shown; findings below may reference images not displayed]

FINDINGS: Brain: Cerebral volume within normal limits. Minimal patchy T2/FLAIR
signal abnormality seen involving the periventricular white matter,
nonspecific, but favored to be related to chronic microvascular
ischemic disease. Small remote lacunar infarct noted at the
posterior left frontal corona radiata.

Now seen is an evolving 7 mm acute to early subacute ischemic
nonhemorrhagic infarct involving the right dorsal lateral medulla
(series 5, image 7). While this may have been present on previous
brain MRI, this was extremely difficult to see at that time. No
significant regional mass effect.

No other evidence for acute or interval infarction. Gray-white
matter differentiation otherwise maintained. No areas of chronic
cortical infarction. No acute or chronic intracranial blood
products.

No mass lesion, midline shift or mass effect. No hydrocephalus or
extra-axial fluid collection. Pituitary gland suprasellar region
within normal limits. Midline structures intact and normally formed.
No other abnormal enhancement.

Vascular: Major intracranial vascular flow voids are maintained.

Skull and upper cervical spine: Craniocervical junction within
normal limits. Bone marrow signal intensity within normal limits. No
scalp soft tissue abnormality.

Sinuses/Orbits: Globes and orbital soft tissues demonstrate no acute
finding. Scattered mucosal thickening noted throughout the ethmoidal
air cells. No mastoid effusion. Inner ear structures grossly normal.

Other: None.
IMPRESSION: 1. 7 mm acute to early subacute ischemic nonhemorrhagic infarct
involving the right dorsolateral medulla.
2. No other acute intracranial abnormality.
3. Underlying mild T2/FLAIR hyperintensity involving the
periventricular white matter, nonspecific, but favored to be related
to chronic microvascular ischemic disease. Small remote lacunar
infarct at the posterior left frontal centrum semi ovale.

## 2021-09-15 MED ORDER — AMLODIPINE BESYLATE 5 MG PO TABS
5.0000 mg | ORAL_TABLET | Freq: Every day | ORAL | Status: DC
  Administered 2021-09-15: 5 mg via ORAL
  Filled 2021-09-15: qty 1

## 2021-09-15 MED ORDER — ATORVASTATIN CALCIUM 20 MG PO TABS
40.0000 mg | ORAL_TABLET | Freq: Every day | ORAL | Status: DC
  Administered 2021-09-15 – 2021-09-16 (×2): 40 mg via ORAL
  Filled 2021-09-15 (×2): qty 2

## 2021-09-15 MED ORDER — GADOBUTROL 1 MMOL/ML IV SOLN
10.0000 mL | Freq: Once | INTRAVENOUS | Status: AC | PRN
  Administered 2021-09-15: 10 mL via INTRAVENOUS

## 2021-09-15 MED ORDER — CLOPIDOGREL BISULFATE 75 MG PO TABS
75.0000 mg | ORAL_TABLET | Freq: Every day | ORAL | Status: DC
  Administered 2021-09-15 – 2021-09-16 (×2): 75 mg via ORAL
  Filled 2021-09-15 (×2): qty 1

## 2021-09-15 NOTE — Progress Notes (Signed)
Subjective: Still with gait unsteadiness.   Objective: Current vital signs: BP (!) 160/110 (BP Location: Right Arm)   Pulse 85   Temp 98.2 F (36.8 C)   Resp 18   Ht 5\' 11"  (1.803 m)   Wt 90.8 kg   SpO2 97%   BMI 27.92 kg/m  Vital signs in last 24 hours: Temp:  [98.1 F (36.7 C)-99 F (37.2 C)] 98.2 F (36.8 C) (05/31 0738) Pulse Rate:  [58-89] 85 (05/31 0738) Resp:  [18] 18 (05/31 0355) BP: (140-171)/(95-110) 160/110 (05/31 0738) SpO2:  [97 %-100 %] 97 % (05/31 0738)  Intake/Output from previous day: No intake/output data recorded. Intake/Output this shift: No intake/output data recorded. Nutritional status:  Diet Order             Diet regular Room service appropriate? Yes; Fluid consistency: Thin  Diet effective now                  HEENT: [place]/AT Lungs: Respirations unlabored Skin: No rash to face/head/neck or distal arms/legs  Neurological Examination Mental Status: Awake, alert and oriented. Speech fluent without evidence for aphasia. No dysarthria.  Cranial Nerves: II: Fixates and tracks normally.    III,IV, VI: No ptosis. Eyes are conjugate.  VII: Face symmetric  VIII: Hearing intact to voice IX,X: Phonation intact Gait: Significantly unsteady gait with leaning to the right, requiring a walker and contact guard.    Lab Results: Results for orders placed or performed during the hospital encounter of 09/13/21 (from the past 48 hour(s))  CBG monitoring, ED     Status: None   Collection Time: 09/13/21  1:53 PM  Result Value Ref Range   Glucose-Capillary 97 70 - 99 mg/dL    Comment: Glucose reference range applies only to samples taken after fasting for at least 8 hours.  Protime-INR     Status: None   Collection Time: 09/13/21  3:19 PM  Result Value Ref Range   Prothrombin Time 14.5 11.4 - 15.2 seconds   INR 1.1 0.8 - 1.2    Comment: (NOTE) INR goal varies based on device and disease states. Performed at [facility], [address] [place], [place] [address]   APTT     Status: None   Collection Time: 09/13/21  3:19 PM  Result Value Ref Range   aPTT 34 24 - 36 seconds    Comment: Performed at [facility], [address] [place], [place] [address]  CBC     Status: None   Collection Time: 09/13/21  3:19 PM  Result Value Ref Range   WBC 9.4 4.0 - 10.5 K/uL   RBC 4.77 4.22 - 5.81 MIL/uL   Hemoglobin 14.5 13.0 - 17.0 g/dL   HCT 43.6 39.0 - 52.0 %   MCV 91.4 80.0 - 100.0 fL   MCH 30.4 26.0 - 34.0 pg   MCHC 33.3 30.0 - 36.0 g/dL   RDW 13.7 11.5 - 15.5 %   Platelets 216 150 - 400 K/uL   nRBC 0.0 0.0 - 0.2 %    Comment: Performed at [facility], [address], [place], [place] [address]  Differential     Status: None   Collection Time: 09/13/21  3:19 PM  Result Value Ref Range   Neutrophils Relative % 77 %   Neutro Abs 7.1 1.7 - 7.7 K/uL   Lymphocytes Relative 13 %   Lymphs Abs 1.3 0.7 - 4.0 K/uL   Monocytes Relative 9 %  Monocytes Absolute 0.9 0.1 - 1.0 K/uL   Eosinophils Relative 1 %   Eosinophils Absolute 0.1 0.0 - 0.5 K/uL   Basophils Relative 0 %   Basophils Absolute 0.0 0.0 - 0.1 K/uL   Immature Granulocytes 0 %   Abs Immature Granulocytes 0.04 0.00 - 0.07 K/uL    Comment: Performed at [facility], [address] [place], [place] [address]  Comprehensive metabolic panel     Status: None   Collection Time: 09/13/21  3:19 PM  Result Value Ref Range   Sodium 138 135 - 145 mmol/L   Potassium 3.6 3.5 - 5.1 mmol/L   Chloride 103 98 - 111 mmol/L   CO2 26 22 - 32 mmol/L   Glucose, Bld 99 70 - 99 mg/dL    Comment: Glucose reference range applies only to samples taken after fasting for at least 8 hours.   BUN 19 6 - 20 mg/dL   Creatinine, Ser 1.03 0.61 - 1.24 mg/dL   Calcium 9.2 8.9 - 10.3 mg/dL   Total Protein 7.8 6.5 - 8.1 g/dL   Albumin 4.2 3.5 - 5.0 g/dL   AST 18 15 - 41 U/L   ALT 18 0 - 44 U/L   Alkaline Phosphatase 94 38 - 126 U/L   Total Bilirubin 0.5 0.3 - 1.2  mg/dL   GFR, Estimated >60 >60 mL/min    Comment: (NOTE) Calculated using the CKD-EPI Creatinine Equation (2021)    Anion gap 9 5 - 15    Comment: Performed at [facility], [address] [place], [place] [address]  Hemoglobin A1c     Status: None   Collection Time: 09/13/21  3:19 PM  Result Value Ref Range   Hgb A1c MFr Bld 5.6 4.8 - 5.6 %    Comment: (NOTE) Pre diabetes:          5.7%-6.4%  Diabetes:              >6.4%  Glycemic control for   <7.0% adults with diabetes    Mean Plasma Glucose 114.02 mg/dL    Comment: Performed at [facility] [address], [place], [place] [id number]  Basic metabolic panel     Status: Abnormal   Collection Time: 09/14/21  4:36 AM  Result Value Ref Range   Sodium 141 135 - 145 mmol/L   Potassium 3.3 (L) 3.5 - 5.1 mmol/L   Chloride 106 98 - 111 mmol/L   CO2 26 22 - 32 mmol/L   Glucose, Bld 88 70 - 99 mg/dL    Comment: Glucose reference range applies only to samples taken after fasting for at least 8 hours.   BUN 22 (H) 6 - 20 mg/dL   Creatinine, Ser 1.23 0.61 - 1.24 mg/dL   Calcium 8.9 8.9 - 10.3 mg/dL   GFR, Estimated >60 >60 mL/min    Comment: (NOTE) Calculated using the CKD-EPI Creatinine Equation (2021)    Anion gap 9 5 - 15    Comment: Performed at [facility], [address] [place], [place] [address]  CBC     Status: None   Collection Time: 09/14/21  4:36 AM  Result Value Ref Range   WBC 9.0 4.0 - 10.5 K/uL   RBC 4.31 4.22 - 5.81 MIL/uL   Hemoglobin 13.0 13.0 - 17.0 g/dL   HCT 39.0 39.0 - 52.0 %   MCV 90.5 80.0 - 100.0 fL   MCH 30.2 26.0 - 34.0 pg   MCHC  33.3 30.0 - 36.0 g/dL   RDW 13.7 11.5 - 15.5 %   Platelets 216 150 - 400 K/uL   nRBC 0.0 0.0 - 0.2 %    Comment: Performed at [facility], [address] [place], [place] [address]  Magnesium     Status: None   Collection Time: 09/14/21  4:36 AM  Result Value Ref Range   Magnesium 2.4 1.7 - 2.4 mg/dL    Comment: Performed at  [facility], [place], [place] [address]  HIV Antibody (routine testing w rflx)     Status: None   Collection Time: 09/14/21  4:36 AM  Result Value Ref Range   HIV Screen 4th Generation wRfx Non Reactive Non Reactive    Comment: Performed at [facility], [place] [address], [place], [place] [address]  Lipid panel     Status: Abnormal   Collection Time: 09/14/21  4:36 AM  Result Value Ref Range   Cholesterol 155 0 - 200 mg/dL   Triglycerides 135 <150 mg/dL   HDL 32 (L) >40 mg/dL   Total CHOL/HDL Ratio 4.8 RATIO   VLDL 27 0 - 40 mg/dL   LDL Cholesterol 96 0 - 99 mg/dL    Comment:        Total Cholesterol/HDL:CHD Risk Coronary Heart Disease Risk Table                     Men   Women  1/2 Average Risk   3.4   3.3  Average Risk       5.0   4.4  2 X Average Risk   9.6   7.1  3 X Average Risk  23.4   11.0        Use the calculated Patient Ratio above and the CHD Risk Table to determine the patient's CHD Risk.        ATP III CLASSIFICATION (LDL):  <100     mg/dL   Optimal  100-129  mg/dL   Near or Above                    Optimal  130-159  mg/dL   Borderline  160-189  mg/dL   High  >190     mg/dL   Very High Performed at [facility], [address] [place], [place] [id number]   Basic metabolic panel     Status: Abnormal   Collection Time: 09/15/21  4:20 AM  Result Value Ref Range   Sodium 139 135 - 145 mmol/L   Potassium 3.9 3.5 - 5.1 mmol/L   Chloride 105 98 - 111 mmol/L   CO2 27 22 - 32 mmol/L   Glucose, Bld 100 (H) 70 - 99 mg/dL    Comment: Glucose reference range applies only to samples taken after fasting for at least 8 hours.   BUN 18 6 - 20 mg/dL   Creatinine, Ser 1.12 0.61 - 1.24 mg/dL   Calcium 9.1 8.9 - 10.3 mg/dL   GFR, Estimated >60 >60 mL/min    Comment: (NOTE) Calculated using the CKD-EPI Creatinine Equation (2021)    Anion gap 7 5 - 15    Comment: Performed at [facility], [address] [place],   [address]  CBC     Status: None   Collection Time: 09/15/21  4:20 AM  Result Value Ref Range   WBC 7.3 4.0 - 10.5 K/uL   RBC 4.54 4.22 -  5.81 MIL/uL   Hemoglobin 13.9 13.0 - 17.0 g/dL   HCT 09.3 26.7 - 12.4 %   MCV 91.9 80.0 - 100.0 fL   MCH 30.6 26.0 - 34.0 pg   MCHC 33.3 30.0 - 36.0 g/dL   RDW 58.0 99.8 - 33.8 %   Platelets 224 150 - 400 K/uL   nRBC 0.0 0.0 - 0.2 %    Comment: Performed at [facility], [address] [place], [place] [address]  Magnesium     Status: None   Collection Time: 09/15/21  4:20 AM  Result Value Ref Range   Magnesium 2.4 1.7 - 2.4 mg/dL    Comment: Performed at [facility], [address] [place], [place] [address]    No results found for this or any previous visit (from the past 240 hour(s)).  Lipid Panel Recent Labs    09/14/21 0436  CHOL 155  TRIG 135  HDL 32*  CHOLHDL 4.8  VLDL 27  LDLCALC 96    Studies/Results: MR BRAIN WO CONTRAST  Result Date: 09/13/2021 CLINICAL DATA:  Neuro deficit, acute, stroke suspected EXAM: MRI HEAD WITHOUT CONTRAST TECHNIQUE: Multiplanar, multiecho pulse sequences of the brain and surrounding structures were obtained without intravenous contrast. COMPARISON:  CT head from the same day. FINDINGS: Limited stroke protocol with only DWI, FLAIR and SWI imaging. Within this limitation: Brain: No acute infarction, hemorrhage, hydrocephalus, extra-axial collection or mass lesion. Small remote lacunar infarct in the left corona radiata. Mild chronic microvascular disease. Vascular: Limited assessment without axial T2. Skull and upper cervical spine: Limited assessment without T1. No obvious marrow signal abnormality. Sinuses/Orbits: Mild paranasal sinus mucosal thickening. No obvious acute orbital abnormality. Other: No sizable mastoid effusions. IMPRESSION: Limited stroke protocol without evidence of acute intracranial abnormality. Electronically Signed   By: Feliberto Harts M.D.   On: 09/13/2021 16:18    MR BRAIN W WO CONTRAST  Result Date: 09/15/2021 CLINICAL DATA:  Follow-up examination for stroke. EXAM: MRI HEAD WITHOUT AND WITH CONTRAST TECHNIQUE: Multiplanar, multiecho pulse sequences of the brain and surrounding structures were obtained without and with intravenous contrast. CONTRAST:  28mL GADAVIST GADOBUTROL 1 MMOL/ML IV SOLN COMPARISON:  Multiple previous exams from 09/13/2021. FINDINGS: Brain: Cerebral volume within normal limits. Minimal patchy T2/FLAIR signal abnormality seen involving the periventricular white matter, nonspecific, but favored to be related to chronic microvascular ischemic disease. Small remote lacunar infarct noted at the posterior left frontal corona radiata. Now seen is an evolving 7 mm acute to early subacute ischemic nonhemorrhagic infarct involving the right dorsal lateral medulla (series 5, image 7). While this may have been present on previous brain MRI, this was extremely difficult to see at that time. No significant regional mass effect. No other evidence for acute or interval infarction. Gray-white matter differentiation otherwise maintained. No areas of chronic cortical infarction. No acute or chronic intracranial blood products. No mass lesion, midline shift or mass effect. No hydrocephalus or extra-axial fluid collection. Pituitary gland suprasellar region within normal limits. Midline structures intact and normally formed. No other abnormal enhancement. Vascular: Major intracranial vascular flow voids are maintained. Skull and upper cervical spine: Craniocervical junction within normal limits. Bone marrow signal intensity within normal limits. No scalp soft tissue abnormality. Sinuses/Orbits: Globes and orbital soft tissues demonstrate no acute finding. Scattered mucosal thickening noted throughout the ethmoidal air cells. No mastoid effusion. Inner ear structures grossly normal. Other: None. IMPRESSION: 1. 7 mm acute to early subacute ischemic nonhemorrhagic  infarct involving the right dorsolateral  medulla. 2. No other acute intracranial abnormality. 3. Underlying mild T2/FLAIR hyperintensity involving the periventricular white matter, nonspecific, but favored to be related to chronic microvascular ischemic disease. Small remote lacunar infarct at the posterior left frontal centrum semi ovale. Electronically Signed   By: Jeannine Boga M.D.   On: 09/15/2021 04:49   ECHOCARDIOGRAM COMPLETE  Result Date: 09/14/2021    ECHOCARDIOGRAM REPORT   Patient Name:   ANGELGABRIEL KRANE Date of Exam: 09/14/2021 Medical Rec #:  [id number]      Height:       71.0 in Accession #:    [id number]     Weight:       200.2 lb Date of Birth:  [birth year]      BSA:          2.109 m Patient Age:    36 years       BP:           149/99 mmHg Patient Gender: M              HR:           59 bpm. Exam Location:  Inpatient Procedure: 2D Echo, 3D Echo, Cardiac Doppler, Color Doppler and Strain Analysis Indications:     Stroke I63.9  History:         Patient has no prior history of Echocardiogram examinations.                  Risk Factors:Hypertension.  Sonographer:     Darlina Sicilian RDCS Referring Phys:  [id number] [place] Diagnosing Phys: Nelva Bush MD IMPRESSIONS  1. Left ventricular ejection fraction, by estimation, is 55 to 60%. The left ventricle has normal function. The left ventricle has no regional wall motion abnormalities. There is moderate left ventricular hypertrophy. Left ventricular diastolic parameters are consistent with Grade II diastolic dysfunction (pseudonormalization).  2. Right ventricular systolic function is normal. The right ventricular size is normal. Tricuspid regurgitation signal is inadequate for assessing PA pressure.  3. The mitral valve is normal in structure. Trivial mitral valve regurgitation.  4. The aortic valve is tricuspid. Aortic valve regurgitation is not visualized. No aortic stenosis is present.  5. There is borderline dilatation of the ascending aorta,  measuring 37 mm.  6. The inferior vena cava is normal in size with greater than 50% respiratory variability, suggesting right atrial pressure of 3 mmHg. FINDINGS  Left Ventricle: Left ventricular ejection fraction, by estimation, is 55 to 60%. The left ventricle has normal function. The left ventricle has no regional wall motion abnormalities. The left ventricular internal cavity size was normal in size. There is  moderate left ventricular hypertrophy. Left ventricular diastolic parameters are consistent with Grade II diastolic dysfunction (pseudonormalization). Right Ventricle: The right ventricular size is normal. No increase in right ventricular wall thickness. Right ventricular systolic function is normal. Tricuspid regurgitation signal is inadequate for assessing PA pressure. Left Atrium: Left atrial size was normal in size. Right Atrium: Right atrial size was normal in size. Pericardium: There is no evidence of pericardial effusion. Mitral Valve: The mitral valve is normal in structure. Trivial mitral valve regurgitation. Tricuspid Valve: The tricuspid valve is normal in structure. Tricuspid valve regurgitation is trivial. Aortic Valve: The aortic valve is tricuspid. Aortic valve regurgitation is not visualized. No aortic stenosis is present. Pulmonic Valve: The pulmonic valve was normal in structure. Pulmonic valve regurgitation is mild. No evidence of pulmonic stenosis. Aorta: The aortic root is normal in size and structure.  There is borderline dilatation of the ascending aorta, measuring 37 mm. Pulmonary Artery: The pulmonary artery is of normal size. Venous: The inferior vena cava is normal in size with greater than 50% respiratory variability, suggesting right atrial pressure of 3 mmHg. IAS/Shunts: No atrial level shunt detected by color flow Doppler.  LEFT VENTRICLE PLAX 2D LVIDd:         4.60 cm   Diastology LVIDs:         3.10 cm   LV e' medial:    7.51 cm/s LV PW:         1.40 cm   LV E/e' medial:   10.0 LV IVS:        1.30 cm   LV e' lateral:   7.83 cm/s LVOT diam:     2.40 cm   LV E/e' lateral: 9.6 LV SV:         88 LV SV Index:   42        2D Longitudinal Strain LVOT Area:     4.52 cm  2D Strain GLS Avg:     -18.2 %                           3D Volume EF:                          3D EF:        59 %                          LV EDV:       189 ml                          LV ESV:       77 ml                          LV SV:        111 ml RIGHT VENTRICLE RV S prime:     12.30 cm/s TAPSE (M-mode): 2.1 cm LEFT ATRIUM             Index        RIGHT ATRIUM           Index LA diam:        3.50 cm 1.66 cm/m   RA Area:     16.80 cm LA Vol (A2C):   52.2 ml 24.75 ml/m  RA Volume:   38.50 ml  18.25 ml/m LA Vol (A4C):   40.7 ml 19.27 ml/m LA Biplane Vol: 50.4 ml 23.89 ml/m  AORTIC VALVE LVOT Vmax:   95.30 cm/s LVOT Vmean:  59.700 cm/s LVOT VTI:    0.195 m  AORTA Ao Root diam: 3.40 cm Ao Asc diam:  3.70 cm MITRAL VALVE MV Area (PHT): 3.36 cm    SHUNTS MV Decel Time: 226 msec    Systemic VTI:  0.20 m MV E velocity: 75.00 cm/s  Systemic Diam: 2.40 cm MV A velocity: 35.10 cm/s MV E/A ratio:  2.14 Harrell Gave End MD Electronically signed by Nelva Bush MD Signature Date/Time: 09/14/2021/12:07:31 PM    Final    CT HEAD CODE STROKE WO CONTRAST  Result Date: 09/13/2021 CLINICAL DATA:  Code stroke.  Neuro deficit, acute, stroke suspected EXAM: CT HEAD WITHOUT CONTRAST TECHNIQUE: Contiguous axial images were  obtained from the base of the skull through the vertex without intravenous contrast. RADIATION DOSE REDUCTION: This exam was performed according to the departmental dose-optimization program which includes automated exposure control, adjustment of the mA and/or kV according to patient size and/or use of iterative reconstruction technique. COMPARISON:  None Available. FINDINGS: Brain: No evidence of acute large vascular territory infarction, hemorrhage, hydrocephalus, extra-axial collection or mass lesion/mass  effect. Vascular: No hyperdense vessel identified. Skull: No acute fracture. Sinuses/Orbits: Clear sinuses. Other: No mastoid effusions. ASPECTS [facility] Stroke Program Early CT Score) total score (0-10 with 10 being normal): 10. Provider paged for call report at the time of dictation. IMPRESSION: No evidence of acute intracranial abnormality. ASPECTS is 10. Electronically Signed   By: Margaretha Sheffield M.D.   On: 09/13/2021 14:21   CT ANGIO HEAD NECK W WO CM (CODE STROKE)  Result Date: 09/13/2021 CLINICAL DATA:  Neuro deficit, acute, stroke suspected EXAM: CT ANGIOGRAPHY HEAD AND NECK TECHNIQUE: Multidetector CT imaging of the head and neck was performed using the standard protocol during bolus administration of intravenous contrast. Multiplanar CT image reconstructions and MIPs were obtained to evaluate the vascular anatomy. Carotid stenosis measurements (when applicable) are obtained utilizing NASCET criteria, using the distal internal carotid diameter as the denominator. RADIATION DOSE REDUCTION: This exam was performed according to the departmental dose-optimization program which includes automated exposure control, adjustment of the mA and/or kV according to patient size and/or use of iterative reconstruction technique. CONTRAST:  76mL OMNIPAQUE IOHEXOL 350 MG/ML SOLN COMPARISON:  None Available. FINDINGS: CTA NECK FINDINGS Aortic arch: Great vessel origins are patent without significant stenosis. Right carotid system: No evidence of dissection, stenosis (50% or greater) or occlusion. Left carotid system: No evidence of dissection, stenosis (50% or greater) or occlusion. Vertebral arteries: Right dominant. No evidence of dissection, stenosis (50% or greater) or occlusion. Skeleton: No acute abnormality. Other neck: No acute abnormality. Upper chest: Visualized lung apices are clear. Review of the MIP images confirms the above findings CTA HEAD FINDINGS Evaluation limited by venous contrast bolus timing.  Anterior circulation: Bilateral intracranial ICAs, MCAs, and ACAs are patent without proximal hemodynamically significant stenosis. Posterior circulation: Both intradural vertebral arteries are patent with mild left intradural vertebral artery stenosis. The basilar artery and bilateral posterior cerebral arteries are patent without proximal hemodynamically significant stenosis. Small right P1 PCA with prominent right posterior communicating artery, anatomic variant. No evidence of proximal hemodynamically significant stenosis. No aneurysm identified. Venous sinuses: As permitted by contrast timing, patent. Review of the MIP images confirms the above findings IMPRESSION: No emergent large vessel occlusion or proximal hemodynamically significant stenosis. Findings discussed with Dr. Cheral Marker via telephone at 2:45 p.m. Electronically Signed   By: Margaretha Sheffield M.D.   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 Exam findings on initial evaluation best localized as a right cerebellar lesion, but MRI brain came back negative. Examination tyesterday was similar to Monday, but also with some relatively subtle new findings. Exam today with continued gait instability characterized by an antalgic gait with leaning to the right, requiring contact guard and walker.  2. Imaging: - Initial MRI brain did reveal a chronic periventricular white matter microinfarction in addition  to early changes most consistent with chronic small vessel disease. These are imaging markers typical of long-standing uncontrolled HTN.  - Repeat MRI brain with and without contrast reveals an acute to early subacute ischemic nonhemorrhagic infarct involving the right dorsolateral  medulla that was not visible on the initial MRI study. - CTA of head and neck showed no dissection, emergent large vessel occlusion or proximal hemodynamically significant stenosis.  3. EKG with sinus rhythm.  4. TTE shows findings suggestive of chronic HTN.       Recommendations: 1. Frequent neuro checks 2. BP management with goal SBP of 130-150. Out of the permissive HTN time window.  3. HgbA1c, fasting lipid panel 4. Cardiac telemetry 5. ASA 81 mg po qd. Adding Plavix 75 mg po qd. Continue DAPT for 3 weeks, then stop Plavix and continue with ASA.  6. PT/OT/Speech 7. Statin. 8. Most likely will need to be started on a scheduled PO antihypertensive prior to discharge. 9. Home BP monitoring on a daily basis after discharge.  10. Will most likely need further therapy with inpatient rehab.  11. No further recommendations. Neurohospitalist service will follow on a PRN basis.    LOS: 1 day   @Electronically  signed: Dr. Kerney Elbe 09/15/2021  8:35 AM

## 2021-09-15 NOTE — Hospital Course (Signed)
Taken from prior notes.  Shawn [name] is a 36 y.o. male with medical history significant of HTN who presented with sudden onset balance issue and unable to walk.    Pt reported balance issue affecting his walk that started between 11 am-12 pm prior to presentation.  Pt said he felt the room spinning, felt dizzy, and when he tried to walk, he leaned to the right side.  Also reported having numbness in his lips and headache.  Pt came to the ED as Code Stroke.  While in the ED, pt had 2 episodes of vomiting.  One of the vomitus was brown so initially thought to have hematemesis, later GI rule out any GI bleeding and hemoglobin remained stable.  He was admitted to rule out stroke.  Initial CT head and MRI was negative for any acute abnormalities.  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  There was also noted a mild hyperintensity involving the periventricular white matter, nonspecific, but favored to be related to chronic microvascular ischemic disease.  Small remote lacunar infarct at the posterior left frontal centrum semi ovale.  CTA of head and neck was showed no dissection, emergent large vessel occlusion or proximal hemodynamically significant stenosis.  Differential of complicated migraine, MS and hypertensive urgency.  Patient is a known hypertensive but not taking any blood pressure medications and his blood pressure was 190/128 on arrival. Labs pretty much unremarkable.  Lipid profile with normal total cholesterol and LDL of 96. A1c of 5.6.  Echocardiogram was normal. Patient continued to have imbalance and gait instability with leaning towards right.  PT is recommending CIR.

## 2021-09-15 NOTE — Progress Notes (Signed)
Occupational Therapy Treatment Patient Details Name: Shawn [name] MRN: [id number] DOB: December 11, 1985 Today's Date: 09/15/2021   History of present illness Shawn [name] is a 36 y.o. male with medical history significant of HTN who presented with sudden onset balance issue, lip numbness, headacheand, and unable to walk. 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   OT comments  Shawn [name] was seen for OT treatment on this date. Upon arrival to room pt reclined in bed with supportive family in room. Pt eager and agreeable to tx. Pt completed hallway navigation task with initial MOD A with no UE support (assist for balance) improving to MIN A with R eye closed (pt reports double vision). MIN A for functional reach task removing objects from knee height cabinet, ankle height cabinet, and floor - R lateral lean noted. Pt completed standing marches - requires MIN A to maintain midline when LLE lifted. Pt making good progress toward goals.  Will continue to follow POC. Pt continues to be excellent candidate for CIR.     Recommendations for follow up therapy are one component of a multi-disciplinary discharge planning process, led by the attending physician.  Recommendations may be updated based on patient status, additional functional criteria and insurance authorization.    Follow Up Recommendations  Acute inpatient rehab (3hours/day)    Assistance Recommended at Discharge Intermittent Supervision/Assistance  Patient can return home with the following  A lot of help with walking and/or transfers;A little help with bathing/dressing/bathroom;Help with stairs or ramp for entrance   Equipment Recommendations  None recommended by OT    Recommendations for Other Services      Precautions / Restrictions Precautions Precautions: Fall Restrictions Weight Bearing Restrictions: No       Mobility Bed Mobility Overal bed  mobility: Modified Independent                  Transfers Overall transfer level: Needs assistance Equipment used: 1 person hand held assist Transfers: Sit to/from Stand Sit to Stand: Min assist                 Balance Overall balance assessment: Needs assistance Sitting-balance support: Feet supported Sitting balance-Leahy Scale: Good     Standing balance support: During functional activity, No upper extremity supported Standing balance-Leahy Scale: Fair                             ADL either performed or assessed with clinical judgement   ADL Overall ADL's : Needs assistance/impaired                                       General ADL Comments: Completed hallway navigation task with initial MOD A with no UE support (assist for balance) improving to MIN A with R eye closed (pt reports double vision). MIN A for functional reach task removing objects from knee height cabinet, ankle height cabinet, and floor - R lateral lean noted.      Cognition Arousal/Alertness: Awake/alert Behavior During Therapy: Flat affect Overall Cognitive Status: Within Functional Limits for tasks assessed                                 General Comments: improved insight into deficits, intetmittently jokes  with his brother in room        Exercises Exercises: General Lower Extremity General Exercises - Lower Extremity Hip Flexion/Marching: AROM, Strengthening, Both, 20 reps, Standing            Pertinent Vitals/ Pain       Pain Assessment Pain Assessment: No/denies pain   Frequency  Min 5X/week        Progress Toward Goals  OT Goals(current goals can now be found in the care plan section)  Progress towards OT goals: Progressing toward goals  Acute Rehab OT Goals Patient Stated Goal: to go to CIR OT Goal Formulation: With patient/family Time For Goal Achievement: 09/28/21 Potential to Achieve Goals: Good ADL Goals Pt Will  Perform Grooming: standing;Independently Pt Will Transfer to Toilet: Independently;ambulating;regular height toilet Additional ADL Goal #1: Pt will Independently complete bed making task with no cues for safety  Plan Discharge plan remains appropriate;Frequency needs to be updated    Co-evaluation                 AM-PAC OT "6 Clicks" Daily Activity     Outcome Measure   Help from another person eating meals?: None Help from another person taking care of personal grooming?: A Little Help from another person toileting, which includes using toliet, bedpan, or urinal?: A Little Help from another person bathing (including washing, rinsing, drying)?: A Little Help from another person to put on and taking off regular upper body clothing?: None Help from another person to put on and taking off regular lower body clothing?: A Little 6 Click Score: 20    End of Session Equipment Utilized During Treatment: Gait belt  OT Visit Diagnosis: Other abnormalities of gait and mobility (R26.89);Muscle weakness (generalized) (M62.81)   Activity Tolerance Patient tolerated treatment well   Patient Left in bed;with call bell/phone within reach;with family/visitor present   Nurse Communication          Time: 1550-1610 OT Time Calculation (min): 20 min  Charges: OT General Charges $OT Visit: 1 Visit OT Treatments $Therapeutic Exercise: 8-22 mins  Kathie Dike, M.S. OTR/L  09/15/21, 4:33 PM  ascom [phone]

## 2021-09-15 NOTE — Progress Notes (Signed)
Physical Therapy Treatment Patient Details Name: Shawn [name] MRN: [id number] DOB: 05-04-1985 Today's Date: 09/15/2021   History of Present Illness HICKS FEICK is a 36 y.o. male with medical history significant of HTN who presented with sudden onset balance issue, lip numbness, headacheand, and unable to walk.    PT Comments    Patient alert, agreeable to PT, still with some flat affect today, but agreeable and motivated. PT and pt discussed IR discharge recommendations, lifestyle modifications to decrease risk of stroke, pt verbalized understanding. The patient was able to maintain midline better today (R lateral lean in sitting and standing) with mirror feedback and tactile cues. He was able to ambulate with varying levels of assist, minA for occasional R lean, facilitation of R knee extension, and cues for heel strike with no UE support or unilateral UE support. Pt was also able to participate in standing balance exercises, CGA-minA and cueing for midline. Overall the patient remains an excellent candidate for acute inpatient rehab, with supportive family at discharge.      Recommendations for follow up therapy are one component of a multi-disciplinary discharge planning process, led by the attending physician.  Recommendations may be updated based on patient status, additional functional criteria and insurance authorization.  Follow Up Recommendations  Acute inpatient rehab (3hours/day)     Assistance Recommended at Discharge Frequent or constant Supervision/Assistance  Patient can return home with the following A lot of help with walking and/or transfers;Assist for transportation;A lot of help with bathing/dressing/bathroom;Assistance with cooking/housework;Help with stairs or ramp for entrance   Equipment Recommendations  Rolling walker (2 wheels)    Recommendations for Other Services       Precautions / Restrictions Precautions Precautions: Fall Restrictions Weight Bearing  Restrictions: No     Mobility  Bed Mobility Overal bed mobility: Modified Independent             General bed mobility comments: increased time    Transfers Overall transfer level: Needs assistance Equipment used: None, Rolling walker (2 wheels) Transfers: Sit to/from Stand Sit to Stand: Min assist, Min guard           General transfer comment: cued for hand positioning with RW, minA to return to sitting for safety; sat too early, minA to finish pivot    Ambulation/Gait   Gait Distance (Feet): 80 Feet Assistive device: Rolling walker (2 wheels), None (hallway rail)   Gait velocity: decreased     General Gait Details: pt with intermittent R lean, though improved from yesterday. minA to ambulate without AD, cued for R heel strike, tactile cues/facilitation of R knee extension in heel strike and stance   Stairs             Wheelchair Mobility    Modified Rankin (Stroke Patients Only)       Balance Overall balance assessment: Needs assistance Sitting-balance support: Feet supported Sitting balance-Leahy Scale: Good     Standing balance support: During functional activity, No upper extremity supported Standing balance-Leahy Scale: Fair Standing balance comment: able to stand statically still, no UE support. for any dynamic tasks requires at least unilateral support                            Cognition Arousal/Alertness: Awake/alert Behavior During Therapy: Flat affect Overall Cognitive Status: Within Functional Limits for tasks assessed  General Comments: pt with more awareness of deficits today, calm, though still flat        Exercises Other Exercises Other Exercises: standing NMR exercises, LAQ with focus on eccentric lowering (x5)    General Comments        Pertinent Vitals/Pain Pain Assessment Pain Assessment: No/denies pain Pain Intervention(s): Repositioned    Home Living  Family/patient expects to be discharged to:: Private residence Living Arrangements: Other relatives                      Prior Function            PT Goals (current goals can now be found in the care plan section) Progress towards PT goals: Progressing toward goals    Frequency    7X/week      PT Plan Current plan remains appropriate    Co-evaluation              AM-PAC PT "6 Clicks" Mobility   Outcome Measure  Help needed turning from your back to your side while in a flat bed without using bedrails?: A Little Help needed moving from lying on your back to sitting on the side of a flat bed without using bedrails?: A Little Help needed moving to and from a bed to a chair (including a wheelchair)?: A Little Help needed standing up from a chair using your arms (e.g., wheelchair or bedside chair)?: A Little Help needed to walk in hospital room?: A Little Help needed climbing 3-5 steps with a railing? : A Lot 6 Click Score: 17    End of Session Equipment Utilized During Treatment: Gait belt Activity Tolerance: Patient tolerated treatment well Patient left: Other (comment) (sitting EOB at end of session, RN aware) Nurse Communication: Mobility status PT Visit Diagnosis: Other abnormalities of gait and mobility (R26.89);Difficulty in walking, not elsewhere classified (R26.2);Muscle weakness (generalized) (M62.81)     Time: 1517-6160 PT Time Calculation (min) (ACUTE ONLY): 29 min  Charges:  $Gait Training: 8-22 mins $Neuromuscular Re-education: 8-22 mins                     Olga Coaster PT, DPT 3:27 PM,09/15/21

## 2021-09-15 NOTE — Assessment & Plan Note (Signed)
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 Completed the stroke work-up. CTA was negative for any large vessel stenosis. Neurology is recommending DAPT for 3 weeks PT is recommending CIR

## 2021-09-15 NOTE — Progress Notes (Signed)
Inpatient Rehab Admissions Coordinator:   Per Therapy recommendations,  patient was screened for CIR candidacy by Lujuana Kapler, MS, CCC-SLP . At this time, Pt. Appears to be a a potential candidate for CIR. I will place   order for rehab consult per protocol for full assessment. Please contact me any with questions.  Latavia Goga, MS, CCC-SLP Rehab Admissions Coordinator  [phone] (celll) [phone] (office)  

## 2021-09-15 NOTE — Care Management (Signed)
[facility] inpatient rehabilitation continuing medical workup to assess if patient is candidate

## 2021-09-15 NOTE — Progress Notes (Signed)
Progress Note   Patient: Shawn [name] BZJ:[id number] DOB: [birth year] DOA: 09/13/2021     1 DOS: the patient was seen and examined on 09/15/2021   Brief hospital course: Taken from prior notes.  PAOLA FLYNT is a 36 y.o. male with medical history significant of HTN who presented with sudden onset balance issue and unable to walk.    Pt reported balance issue affecting his walk that started between 11 am-12 pm prior to presentation.  Pt said he felt the room spinning, felt dizzy, and when he tried to walk, he leaned to the right side.  Also reported having numbness in his lips and headache.  Pt came to the ED as Code Stroke.  While in the ED, pt had 2 episodes of vomiting.  One of the vomitus was brown so initially thought to have hematemesis, later GI rule out any GI bleeding and hemoglobin remained stable.  He was admitted to rule out stroke.  Initial CT head and MRI was negative for any acute abnormalities.  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  There was also noted a mild hyperintensity involving the periventricular white matter, nonspecific, but favored to be related to chronic microvascular ischemic disease.  Small remote lacunar infarct at the posterior left frontal centrum semi ovale.  CTA of head and neck was showed no dissection, emergent large vessel occlusion or proximal hemodynamically significant stenosis.  Differential of complicated migraine, MS and hypertensive urgency.  Patient is a known hypertensive but not taking any blood pressure medications and his blood pressure was 190/128 on arrival. Labs pretty much unremarkable.  Lipid profile with normal total cholesterol and LDL of 96. A1c of 5.6.  Echocardiogram was normal. Patient continued to have imbalance and gait instability with leaning towards right.  PT is  recommending CIR.    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 Completed the stroke work-up. CTA was negative for any large vessel stenosis. Neurology is recommending DAPT for 3 weeks PT is recommending CIR  Essential hypertension Patient with history of hypertension but was not on any home medications. Completed permissive hypertension.  Goal blood pressure below 150. Blood pressure remained little elevated. -Continue lisinopril-started during current hospitalization. -Add amlodipine -Continue to monitor    Subjective: Patient continue to have some gait instability.  Physical Exam: Vitals:   09/15/21 0355 09/15/21 0738 09/15/21 1123 09/15/21 1638  BP: (!) 155/106 (!) 160/110 (!) 158/100 (!) 159/108  Pulse: 89 85 72 80  Resp: 18  16   Temp: 98.1 F (36.7 C) 98.2 F (36.8 C) 98.4 F (36.9 C) 98 F (36.7 C)  TempSrc:    Oral  SpO2: 97% 97% 100% 100%  Weight:      Height:       General.     In no acute distress. Pulmonary.  Lungs clear bilaterally, normal respiratory effort. CV.  Regular rate and rhythm, no JVD, rub or murmur. Abdomen.  Soft, nontender, nondistended, BS positive. CNS.  Alert and oriented .  No focal neurologic deficit.  Gait was not checked Extremities.  No edema, no cyanosis, pulses intact and symmetrical. Psychiatry.  Judgment and insight appears normal.  Data Reviewed: Prior notes, labs and images reviewed  Family Communication:   Disposition: Status is: Inpatient Remains inpatient appropriate because: Severity of illness  Planned Discharge Destination: CIR  DVT prophylaxis.  Lovenox Time spent: 45 minutes  This record has been created using Conservation officer, historic buildings. Errors have been sought and corrected,but may not always be located. Such creation errors do not reflect on the standard of care.  Author: Arnetha Courser, MD 09/15/2021 6:15 PM  For on call review  [email or web address].

## 2021-09-15 NOTE — Assessment & Plan Note (Signed)
Patient with history of hypertension but was not on any home medications. Completed permissive hypertension.  Goal blood pressure below 150. Blood pressure remained little elevated. -Continue lisinopril-started during current hospitalization. -Add amlodipine -Continue to monitor

## 2021-09-16 LAB — BASIC METABOLIC PANEL
Anion gap: 3 — ABNORMAL LOW (ref 5–15)
BUN: 16 mg/dL (ref 6–20)
CO2: 27 mmol/L (ref 22–32)
Calcium: 8.9 mg/dL (ref 8.9–10.3)
Chloride: 110 mmol/L (ref 98–111)
Creatinine, Ser: 1.09 mg/dL (ref 0.61–1.24)
GFR, Estimated: >60 mL/min (ref >60)
Glucose, Bld: 86 mg/dL (ref 70–99)
Potassium: 3.5 mmol/L (ref 3.5–5.1)
Sodium: 140 mmol/L (ref 135–145)

## 2021-09-16 LAB — CBC
HCT: 39.6 % (ref 39.0–52.0)
Hemoglobin: 13.4 g/dL (ref 13.0–17.0)
MCH: 30.9 pg (ref 26.0–34.0)
MCHC: 33.8 g/dL (ref 30.0–36.0)
MCV: 91.5 fL (ref 80.0–100.0)
Platelets: 203 10*3/uL (ref 150–400)
RBC: 4.33 MIL/uL (ref 4.22–5.81)
RDW: 13.9 % (ref 11.5–15.5)
WBC: 7.4 10*3/uL (ref 4.0–10.5)
nRBC: 0 % (ref 0.0–0.2)

## 2021-09-16 LAB — MAGNESIUM: Magnesium: 2 mg/dL (ref 1.7–2.4)

## 2021-09-16 MED ORDER — AMLODIPINE BESYLATE 10 MG PO TABS
10.0000 mg | ORAL_TABLET | Freq: Every day | ORAL | Status: DC
  Administered 2021-09-16: 10 mg via ORAL
  Filled 2021-09-16: qty 1

## 2021-09-16 MED ORDER — LISINOPRIL 40 MG PO TABS
40.0000 mg | ORAL_TABLET | Freq: Every day | ORAL | 1 refills | Status: AC
Start: 2021-09-17 — End: ?

## 2021-09-16 MED ORDER — CLOPIDOGREL BISULFATE 75 MG PO TABS
75.0000 mg | ORAL_TABLET | Freq: Every day | ORAL | 0 refills | Status: AC
Start: 2021-09-17 — End: ?

## 2021-09-16 MED ORDER — ASPIRIN 81 MG PO TBEC: 81.0000 mg | DELAYED_RELEASE_TABLET | Freq: Every day | ORAL | 12 refills | Status: AC

## 2021-09-16 MED ORDER — ATORVASTATIN CALCIUM 40 MG PO TABS: 40.0000 mg | ORAL_TABLET | Freq: Every day | ORAL | 1 refills | Status: AC

## 2021-09-16 MED ORDER — AMLODIPINE BESYLATE 10 MG PO TABS: 10.0000 mg | ORAL_TABLET | Freq: Every day | ORAL | 1 refills | Status: AC

## 2021-09-16 NOTE — Therapy (Signed)
Occupational Therapy * Physical Therapy * Speech Therapy          DATE _____01 Jun 2023______________ PATIENT NAME_____[name]________________ PATIENT MRN______[id number]______________  DIAGNOSIS/DIAGNOSIS CODE ______ Stroke-like symptom R29.90  ________________ DATE OF DISCHARGE: ____01 Jun 2023__________  PRIMARY CARE PHYSICIAN __________________________ PCP PHONE/FAX___________________________     Dear Provider (Name: __________________   Fax: ___________________________):   I certify that I have examined this patient and that occupational/physical/speech therapy is necessary on an outpatient basis.    The patient has expressed interest in completing their recommended course of therapy at your location.  Once a formal order from the patient's primary care physician has been obtained, please contact him/her to schedule an appointment for evaluation at your earliest convenience.   [  x]  Physical Therapy Evaluate and Treat          [x  ]  Occupational Therapy Evaluate and Treat                                    [  ]  Speech Therapy Evaluate and Treat       The patient's primary care physician (listed above) must furnish and be responsible for a formal order such that the recommended services may be furnished while under the primary physician's care, and that the plan of care will be established and reviewed every 30 days (or more often if condition necessitates).

## 2021-09-16 NOTE — TOC Progression Note (Addendum)
Transition of Care [facility]) - Progression Note    Patient Details  Name: Shawn [name] MRN: [id number] Date of Birth: 05/07/1985  Transition of Care [facility]) CM/SW Contact  Caryn Section, RN Phone Number: 09/16/2021, 2:17 PM  Clinical Narrative:   patient discharging with outpatient PT.  His mother is his primary caregiver.  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  Referral for OPPT provided.  Patient has all DME   PCP recources provided for [employer]      Expected Discharge Plan and Services           Expected Discharge Date: 09/16/21                                     Social Determinants of Health (SDOH) Interventions    Readmission Risk Interventions     View : No data to display.

## 2021-09-16 NOTE — Progress Notes (Signed)
[facility] IP rehab admissions - I spoke with patient today by phone.  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  He does have his card with him.  I explained rehab options to patient.  He would like to do outpatient therapy.  He does not want to stay hospitalized and come to inpatient rehab at [facility].  If he changes his mind, please feel free to call me at [phone]

## 2021-09-16 NOTE — Therapy (Incomplete)
Occupational Therapy * Physical Therapy * Speech Therapy          DATE __________01 Jun 2023_________ PATIENT NAME________[name]_____________ PATIENT MRN_____[id number]_______________  DIAGNOSIS/DIAGNOSIS CODE _______ Stroke-like symptom R29.90   DATE OF DISCHARGE: _____01 Jun 2023 _________  PRIMARY CARE PHYSICIAN __________________________ PCP PHONE/FAX___________________________     Dear Provider (Name: __________________   Fax: ___________________________):   I certify that I have examined this patient and that occupational/physical/speech therapy is necessary on an outpatient basis.    The patient has expressed interest in completing their recommended course of therapy at your location.  Once a formal order from the patient's primary care physician has been obtained, please contact him/her to schedule an appointment for evaluation at your earliest convenience.   [  ]  Physical Therapy Evaluate and Treat          [  ]  Occupational Therapy Evaluate and Treat                                    [  ]  Speech Therapy Evaluate and Treat       The patient's primary care physician (listed above) must furnish and be responsible for a formal order such that the recommended services may be furnished while under the primary physician's care, and that the plan of care will be established and reviewed every 30 days (or more often if condition necessitates).

## 2021-09-16 NOTE — Progress Notes (Signed)
Occupational Therapy Treatment Patient Details Name: Shawn [name] MRN: [id number] DOB: 1985-10-24 Today's Date: 09/16/2021   History of present illness Shawn [name] is a 36 y.o. male with medical history significant of HTN who presented with sudden onset balance issue, lip numbness, headacheand, and unable to walk. 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   OT comments  Pt seen for OT treatment on this date. Upon arrival to room pt awake and alert laying in bed. Pt agreeable to tx focusing on balance, sequencing, and dual tasking to improve ADL and IADL participation. Pt required CGA with no AD to step in/out and pick up BSC/walker out of shower. Pt completed sequencing task in hallway CGA with no AD - pt was able to locate and identify all items in correct order w/o vcs. Pt ambulated in hallway ~600 ft, therapist provided perturbations with minor LOBs noted, pt able to self-correct using stagger steps. Pt unable to dual task to naming animals in the alphabet while tossing ball, required significantly increased time and VCs to correctly name animals and ID errors. Completed ~10 mins ball toss with wide BOS, narrow BOS, and L+ R tandem step, minor-moderate LOBs in R tandem, MIN A to correct. Pt making good progress toward goals. All questions answered related to outpatient therapy within scope of practice. Pt continues to benefit from skilled OT services to maximize return to PLOF and occupational performance and minimize risk of future falls/injury. Discharge and frequency updated to outpatient OT.    Recommendations for follow up therapy are one component of a multi-disciplinary discharge planning process, led by the attending physician.  Recommendations may be updated based on patient status, additional functional criteria and insurance authorization.    Follow Up Recommendations  Outpatient OT    Assistance  Recommended at Discharge Intermittent Supervision/Assistance  Patient can return home with the following  A little help with bathing/dressing/bathroom;Help with stairs or ramp for entrance;A little help with walking and/or transfers   Equipment Recommendations  None recommended by OT    Recommendations for Other Services      Precautions / Restrictions Precautions Precautions: Fall Restrictions Weight Bearing Restrictions: No       Mobility Bed Mobility Overal bed mobility: Modified Independent             General bed mobility comments: increased time    Transfers Overall transfer level: Needs assistance Equipment used: None Transfers: Sit to/from Stand Sit to Stand: Supervision           General transfer comment: SBA; no physical assistance     Balance Overall balance assessment: Needs assistance Sitting-balance support: Feet supported Sitting balance-Leahy Scale: Good     Standing balance support: No upper extremity supported, During functional activity Standing balance-Leahy Scale: Fair                             ADL either performed or assessed with clinical judgement   ADL Overall ADL's : Needs assistance/impaired                                       General ADL Comments: CGA with no AD to step in/out and pick up BSC/walker out of shower. Pt completed sequencing task in hallway with CGA with no AD - pt was able to  locate and identify all items in correct order w/o vcs.      Cognition Arousal/Alertness: Awake/alert Behavior During Therapy: Flat affect (Intermittent appropriate responses) Overall Cognitive Status: Impaired/Different from baseline                                 General Comments: During dual task - ball toss, pt pauses with eyes closed to think about questions before responding.        Exercises Exercises: Other exercises. Pt unable to dual task to naming animals in the alphabet while  tossing ball, required significantly increased time and VCs to correctly name animals and ID errors. Completed ~10 mins ball toss with wide BOS, narrow BOS, and L+ R tandem step, increased LOBs in R tandem.            Pertinent Vitals/ Pain       Pain Assessment Pain Assessment: Faces Faces Pain Scale: No hurt   Frequency  Min 3X/week        Progress Toward Goals  OT Goals(current goals can now be found in the care plan section)  Progress towards OT goals: Progressing toward goals  Acute Rehab OT Goals Patient Stated Goal: To go to outpatient therapy OT Goal Formulation: With patient Time For Goal Achievement: 09/28/21 Potential to Achieve Goals: Good ADL Goals Pt Will Perform Grooming: standing;Independently Pt Will Transfer to Toilet: Independently;ambulating;regular height toilet Additional ADL Goal #1: Pt will Independently complete bed making task with no cues for safety  Plan Discharge plan needs to be updated;Frequency needs to be updated    Co-evaluation                 AM-PAC OT "6 Clicks" Daily Activity     Outcome Measure   Help from another person eating meals?: None Help from another person taking care of personal grooming?: A Little Help from another person toileting, which includes using toliet, bedpan, or urinal?: A Little Help from another person bathing (including washing, rinsing, drying)?: A Little Help from another person to put on and taking off regular upper body clothing?: None Help from another person to put on and taking off regular lower body clothing?: A Little 6 Click Score: 20    End of Session Equipment Utilized During Treatment: Gait belt  OT Visit Diagnosis: Other abnormalities of gait and mobility (R26.89);Muscle weakness (generalized) (M62.81)   Activity Tolerance Patient tolerated treatment well   Patient Left in bed;with call bell/phone within reach (Pt left sitting at EOB)   Nurse Communication          Time:  [id number] OT Time Calculation (min): 28 min  Charges: OT General Charges $OT Visit: 1 Visit OT Treatments $Therapeutic Activity: 23-37 mins  [employer] OTDS  [employer] 09/16/2021, 2:07 PM

## 2021-09-16 NOTE — Discharge Summary (Signed)
Physician Discharge Summary   Patient: Shawn [name] MRN: [id number] DOB: 07/23/1985  Admit date:     09/13/2021  Discharge date: 09/16/21  Discharge Physician: Arnetha Courser   PCP: Patient, No Pcp Per (Inactive)   Recommendations at discharge:  Please obtain BMP and CBC in 1 week Follow-up with primary care provider within a week Follow-up with neurology  Discharge Diagnoses: Principal Problem:   Stroke-like symptom Active Problems:   Essential hypertension   Hospital Course: Taken from prior notes.  Shawn [name] is a 36 y.o. male with medical history significant of HTN who presented with sudden onset balance issue and unable to walk.    Pt reported balance issue affecting his walk that started between 11 am-12 pm prior to presentation.  Pt said he felt the room spinning, felt dizzy, and when he tried to walk, he leaned to the right side.  Also reported having numbness in his lips and headache.  Pt came to the ED as Code Stroke.  While in the ED, pt had 2 episodes of vomiting.  One of the vomitus was brown so initially thought to have hematemesis, later GI rule out any GI bleeding and hemoglobin remained stable.  He was admitted to rule out stroke.  Initial CT head and MRI was negative for any acute abnormalities.  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  There was also noted a mild hyperintensity involving the periventricular white matter, nonspecific, but favored to be related to chronic microvascular ischemic disease.  Small remote lacunar infarct at the posterior left frontal centrum semi ovale.  CTA of head and neck was showed no dissection, emergent large vessel occlusion or proximal hemodynamically significant stenosis.  Differential of complicated migraine, MS and hypertensive urgency.  Patient is a known hypertensive  but not taking any blood pressure medications and his blood pressure was 190/128 on arrival. Labs pretty much unremarkable.  Lipid profile with normal total cholesterol and LDL of 96. A1c of 5.6.  Echocardiogram was normal. Patient continued to have imbalance and gait instability with leaning towards right.  PT is recommending CIR. patient decided to go home with outpatient physical therapy as he does not want to stay in hospital for that long. Gait improving and he was able to walk without any help and minimum instability.  He will continue with aspirin and Plavix for 3 weeks followed by aspirin only. He was started on Lipitor. He was also started on amlodipine and lisinopril and needed close follow-up with PCP for further management and better control of his hypertension. Patient will also follow-up with neurology.  He will continue current medications and will follow-up with his providers.   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 Completed the stroke work-up. CTA was negative for any large vessel stenosis. Neurology is recommending DAPT for 3 weeks PT is recommending CIR  Essential hypertension Patient with history of hypertension but was not on any home medications. Completed permissive hypertension.  Goal blood pressure below 150. Blood pressure remained little elevated. -Continue lisinopril-started during current hospitalization. -Add amlodipine -Continue to monitor    Consultants: Neurology Procedures performed: None Disposition: Home Diet recommendation:  Discharge Diet Orders (From admission, onward)     Start     Ordered   09/16/21 0000  Diet - low sodium heart healthy  09/16/21 1313           Cardiac diet DISCHARGE MEDICATION: Allergies as of 09/16/2021   No Known Allergies      Medication List     STOP taking these medications    amoxicillin-clavulanate 875-125 MG tablet Commonly  known as: Augmentin   HYDROcodone-acetaminophen 5-325 MG tablet Commonly known as: Norco   ibuprofen 800 MG tablet Commonly known as: ADVIL       TAKE these medications    amLODipine 10 MG tablet Commonly known as: NORVASC Take 1 tablet (10 mg total) by mouth daily. Start taking on: September 17, 2021   aspirin EC 81 MG tablet Take 1 tablet (81 mg total) by mouth daily. Swallow whole. Start taking on: September 17, 2021   atorvastatin 40 MG tablet Commonly known as: LIPITOR Take 1 tablet (40 mg total) by mouth daily. Start taking on: September 17, 2021   clopidogrel 75 MG tablet Commonly known as: PLAVIX Take 1 tablet (75 mg total) by mouth daily. Start taking on: September 17, 2021   lisinopril 40 MG tablet Commonly known as: ZESTRIL Take 1 tablet (40 mg total) by mouth daily. Start taking on: September 17, 2021        Follow-up Information     Lonell Face, MD. Schedule an appointment as soon as possible for a visit in 1 week(s).   Specialty: Neurology Contact information: [phone] [facility] [address] [facility] [place] [address] [phone]                Discharge Exam: Filed Weights   09/13/21 1423 09/13/21 1847  Weight: 95.3 kg 90.8 kg   General.     In no acute distress. Pulmonary.  Lungs clear bilaterally, normal respiratory effort. CV.  Regular rate and rhythm, no JVD, rub or murmur. Abdomen.  Soft, nontender, nondistended, BS positive. CNS.  Alert and oriented .  No focal neurologic deficit. Extremities.  No edema, no cyanosis, pulses intact and symmetrical. Psychiatry.  Judgment and insight appears normal.   Condition at discharge: stable  The results of significant diagnostics from this hospitalization (including imaging, microbiology, ancillary and laboratory) are listed below for reference.   Imaging Studies: MR BRAIN WO CONTRAST  Result Date: 09/13/2021 CLINICAL DATA:  Neuro deficit, acute, stroke suspected EXAM: MRI HEAD WITHOUT  CONTRAST TECHNIQUE: Multiplanar, multiecho pulse sequences of the brain and surrounding structures were obtained without intravenous contrast. COMPARISON:  CT head from the same day. FINDINGS: Limited stroke protocol with only DWI, FLAIR and SWI imaging. Within this limitation: Brain: No acute infarction, hemorrhage, hydrocephalus, extra-axial collection or mass lesion. Small remote lacunar infarct in the left corona radiata. Mild chronic microvascular disease. Vascular: Limited assessment without axial T2. Skull and upper cervical spine: Limited assessment without T1. No obvious marrow signal abnormality. Sinuses/Orbits: Mild paranasal sinus mucosal thickening. No obvious acute orbital abnormality. Other: No sizable mastoid effusions. IMPRESSION: Limited stroke protocol without evidence of acute intracranial abnormality. Electronically Signed   By: Feliberto Harts M.D.   On: 09/13/2021 16:18   MR BRAIN W WO CONTRAST  Result Date: 09/15/2021 CLINICAL DATA:  Follow-up examination for stroke. EXAM: MRI HEAD WITHOUT AND WITH CONTRAST TECHNIQUE: Multiplanar, multiecho pulse sequences of the brain and surrounding structures were obtained without and with intravenous contrast. CONTRAST:  10mL GADAVIST GADOBUTROL 1 MMOL/ML IV SOLN COMPARISON:  Multiple previous exams from 09/13/2021. FINDINGS: Brain: Cerebral volume within normal limits. Minimal patchy T2/FLAIR signal abnormality seen involving the periventricular white matter,  nonspecific, but favored to be related to chronic microvascular ischemic disease. Small remote lacunar infarct noted at the posterior left frontal corona radiata. Now seen is an evolving 7 mm acute to early subacute ischemic nonhemorrhagic infarct involving the right dorsal lateral medulla (series 5, image 7). While this may have been present on previous brain MRI, this was extremely difficult to see at that time. No significant regional mass effect. No other evidence for acute or interval  infarction. Gray-white matter differentiation otherwise maintained. No areas of chronic cortical infarction. No acute or chronic intracranial blood products. No mass lesion, midline shift or mass effect. No hydrocephalus or extra-axial fluid collection. Pituitary gland suprasellar region within normal limits. Midline structures intact and normally formed. No other abnormal enhancement. Vascular: Major intracranial vascular flow voids are maintained. Skull and upper cervical spine: Craniocervical junction within normal limits. Bone marrow signal intensity within normal limits. No scalp soft tissue abnormality. Sinuses/Orbits: Globes and orbital soft tissues demonstrate no acute finding. Scattered mucosal thickening noted throughout the ethmoidal air cells. No mastoid effusion. Inner ear structures grossly normal. Other: None. IMPRESSION: 1. 7 mm acute to early subacute ischemic nonhemorrhagic infarct involving the right dorsolateral medulla. 2. No other acute intracranial abnormality. 3. Underlying mild T2/FLAIR hyperintensity involving the periventricular white matter, nonspecific, but favored to be related to chronic microvascular ischemic disease. Small remote lacunar infarct at the posterior left frontal centrum semi ovale. Electronically Signed   By: Rise Mu M.D.   On: 09/15/2021 04:49   ECHOCARDIOGRAM COMPLETE  Result Date: 09/14/2021    ECHOCARDIOGRAM REPORT   Patient Name:   HALLIE ISHIDA Date of Exam: 09/14/2021 Medical Rec #:  [id number]      Height:       71.0 in Accession #:    [id number]     Weight:       200.2 lb Date of Birth:  14-Dec-1985      BSA:          2.109 m Patient Age:    36 years       BP:           149/99 mmHg Patient Gender: M              HR:           59 bpm. Exam Location:  Inpatient Procedure: 2D Echo, 3D Echo, Cardiac Doppler, Color Doppler and Strain Analysis Indications:     Stroke I63.9  History:         Patient has no prior history of Echocardiogram examinations.                   Risk Factors:Hypertension.  Sonographer:     Leta Jungling RDCS Referring Phys:  [id number] Inetta Fermo LAI Diagnosing Phys: Yvonne Kendall MD IMPRESSIONS  1. Left ventricular ejection fraction, by estimation, is 55 to 60%. The left ventricle has normal function. The left ventricle has no regional wall motion abnormalities. There is moderate left ventricular hypertrophy. Left ventricular diastolic parameters are consistent with Grade II diastolic dysfunction (pseudonormalization).  2. Right ventricular systolic function is normal. The right ventricular size is normal. Tricuspid regurgitation signal is inadequate for assessing PA pressure.  3. The mitral valve is normal in structure. Trivial mitral valve regurgitation.  4. The aortic valve is tricuspid. Aortic valve regurgitation is not visualized. No aortic stenosis is present.  5. There is borderline dilatation of the ascending aorta, measuring 37 mm.  6. The inferior vena cava is normal  in size with greater than 50% respiratory variability, suggesting right atrial pressure of 3 mmHg. FINDINGS  Left Ventricle: Left ventricular ejection fraction, by estimation, is 55 to 60%. The left ventricle has normal function. The left ventricle has no regional wall motion abnormalities. The left ventricular internal cavity size was normal in size. There is  moderate left ventricular hypertrophy. Left ventricular diastolic parameters are consistent with Grade II diastolic dysfunction (pseudonormalization). Right Ventricle: The right ventricular size is normal. No increase in right ventricular wall thickness. Right ventricular systolic function is normal. Tricuspid regurgitation signal is inadequate for assessing PA pressure. Left Atrium: Left atrial size was normal in size. Right Atrium: Right atrial size was normal in size. Pericardium: There is no evidence of pericardial effusion. Mitral Valve: The mitral valve is normal in structure. Trivial mitral valve regurgitation.  Tricuspid Valve: The tricuspid valve is normal in structure. Tricuspid valve regurgitation is trivial. Aortic Valve: The aortic valve is tricuspid. Aortic valve regurgitation is not visualized. No aortic stenosis is present. Pulmonic Valve: The pulmonic valve was normal in structure. Pulmonic valve regurgitation is mild. No evidence of pulmonic stenosis. Aorta: The aortic root is normal in size and structure. There is borderline dilatation of the ascending aorta, measuring 37 mm. Pulmonary Artery: The pulmonary artery is of normal size. Venous: The inferior vena cava is normal in size with greater than 50% respiratory variability, suggesting right atrial pressure of 3 mmHg. IAS/Shunts: No atrial level shunt detected by color flow Doppler.  LEFT VENTRICLE PLAX 2D LVIDd:         4.60 cm   Diastology LVIDs:         3.10 cm   LV e' medial:    7.51 cm/s LV PW:         1.40 cm   LV E/e' medial:  10.0 LV IVS:        1.30 cm   LV e' lateral:   7.83 cm/s LVOT diam:     2.40 cm   LV E/e' lateral: 9.6 LV SV:         88 LV SV Index:   42        2D Longitudinal Strain LVOT Area:     4.52 cm  2D Strain GLS Avg:     -18.2 %                           3D Volume EF:                          3D EF:        59 %                          LV EDV:       189 ml                          LV ESV:       77 ml                          LV SV:        111 ml RIGHT VENTRICLE RV S prime:     12.30 cm/s TAPSE (M-mode): 2.1 cm LEFT ATRIUM             Index  RIGHT ATRIUM           Index LA diam:        3.50 cm 1.66 cm/m   RA Area:     16.80 cm LA Vol (A2C):   52.2 ml 24.75 ml/m  RA Volume:   38.50 ml  18.25 ml/m LA Vol (A4C):   40.7 ml 19.27 ml/m LA Biplane Vol: 50.4 ml 23.89 ml/m  AORTIC VALVE LVOT Vmax:   95.30 cm/s LVOT Vmean:  59.700 cm/s LVOT VTI:    0.195 m  AORTA Ao Root diam: 3.40 cm Ao Asc diam:  3.70 cm MITRAL VALVE MV Area (PHT): 3.36 cm    SHUNTS MV Decel Time: 226 msec    Systemic VTI:  0.20 m MV E velocity: 75.00 cm/s   Systemic Diam: 2.40 cm MV A velocity: 35.10 cm/s MV E/A ratio:  2.14 Cristal Deerhristopher End MD Electronically signed by Yvonne Kendallhristopher End MD Signature Date/Time: 09/14/2021/12:07:31 PM    Final    CT HEAD CODE STROKE WO CONTRAST  Result Date: 09/13/2021 CLINICAL DATA:  Code stroke.  Neuro deficit, acute, stroke suspected EXAM: CT HEAD WITHOUT CONTRAST TECHNIQUE: Contiguous axial images were obtained from the base of the skull through the vertex without intravenous contrast. RADIATION DOSE REDUCTION: This exam was performed according to the departmental dose-optimization program which includes automated exposure control, adjustment of the mA and/or kV according to patient size and/or use of iterative reconstruction technique. COMPARISON:  None Available. FINDINGS: Brain: No evidence of acute large vascular territory infarction, hemorrhage, hydrocephalus, extra-axial collection or mass lesion/mass effect. Vascular: No hyperdense vessel identified. Skull: No acute fracture. Sinuses/Orbits: Clear sinuses. Other: No mastoid effusions. ASPECTS [facility]([place] Stroke Program Early CT Score) total score (0-10 with 10 being normal): 10. Provider paged for call report at the time of dictation. IMPRESSION: No evidence of acute intracranial abnormality. ASPECTS is 10. Electronically Signed   By: Feliberto HartsFrederick S Jones M.D.   On: 09/13/2021 14:21   CT ANGIO HEAD NECK W WO CM (CODE STROKE)  Result Date: 09/13/2021 CLINICAL DATA:  Neuro deficit, acute, stroke suspected EXAM: CT ANGIOGRAPHY HEAD AND NECK TECHNIQUE: Multidetector CT imaging of the head and neck was performed using the standard protocol during bolus administration of intravenous contrast. Multiplanar CT image reconstructions and MIPs were obtained to evaluate the vascular anatomy. Carotid stenosis measurements (when applicable) are obtained utilizing NASCET criteria, using the distal internal carotid diameter as the denominator. RADIATION DOSE REDUCTION: This exam was performed  according to the departmental dose-optimization program which includes automated exposure control, adjustment of the mA and/or kV according to patient size and/or use of iterative reconstruction technique. CONTRAST:  75mL OMNIPAQUE IOHEXOL 350 MG/ML SOLN COMPARISON:  None Available. FINDINGS: CTA NECK FINDINGS Aortic arch: Great vessel origins are patent without significant stenosis. Right carotid system: No evidence of dissection, stenosis (50% or greater) or occlusion. Left carotid system: No evidence of dissection, stenosis (50% or greater) or occlusion. Vertebral arteries: Right dominant. No evidence of dissection, stenosis (50% or greater) or occlusion. Skeleton: No acute abnormality. Other neck: No acute abnormality. Upper chest: Visualized lung apices are clear. Review of the MIP images confirms the above findings CTA HEAD FINDINGS Evaluation limited by venous contrast bolus timing. Anterior circulation: Bilateral intracranial ICAs, MCAs, and ACAs are patent without proximal hemodynamically significant stenosis. Posterior circulation: Both intradural vertebral arteries are patent with mild left intradural vertebral artery stenosis. The basilar artery and bilateral posterior cerebral arteries are patent without proximal hemodynamically significant stenosis. Small right  P1 PCA with prominent right posterior communicating artery, anatomic variant. No evidence of proximal hemodynamically significant stenosis. No aneurysm identified. Venous sinuses: As permitted by contrast timing, patent. Review of the MIP images confirms the above findings IMPRESSION: No emergent large vessel occlusion or proximal hemodynamically significant stenosis. Findings discussed with Dr. Otelia Limes via telephone at 2:45 p.m. Electronically Signed   By: Feliberto Harts M.D.   On: 09/13/2021 15:04    Microbiology: Results for orders placed or performed during the hospital encounter of 01/04/16  Culture, blood (routine x 2)     Status:  None   Collection Time: 01/04/16  9:22 AM   Specimen: BLOOD  Result Value Ref Range Status   Specimen Description BLOOD LEFT AC  Final   Special Requests   Final    BOTTLES DRAWN AEROBIC AND ANAEROBIC AER ANA   Culture NO GROWTH 5 DAYS  Final   Report Status 01/09/2016 FINAL  Final  Culture, blood (routine x 2)     Status: None   Collection Time: 01/04/16  9:22 AM   Specimen: BLOOD  Result Value Ref Range Status   Specimen Description BLOOD RIGHT AC  Final   Special Requests   Final    BOTTLES DRAWN AEROBIC AND ANAEROBIC AER ANA .   Culture NO GROWTH 5 DAYS  Final   Report Status 01/09/2016 FINAL  Final    Labs: CBC: Recent Labs  Lab 09/13/21 1519 09/14/21 0436 09/15/21 0420 09/16/21 0442  WBC 9.4 9.0 7.3 7.4  NEUTROABS 7.1  --   --   --   HGB 14.5 13.0 13.9 13.4  HCT 43.6 39.0 41.7 39.6  MCV 91.4 90.5 91.9 91.5  PLT 216 216 224 203   Basic Metabolic Panel: Recent Labs  Lab 09/13/21 1519 09/14/21 0436 09/15/21 0420 09/16/21 0442  NA 138 141 139 140  K 3.6 3.3* 3.9 3.5  CL 103 106 105 110  CO2 26 26 27 27   GLUCOSE 99 88 100* 86  BUN 19 22* 18 16  CREATININE 1.03 1.23 1.12 1.09  CALCIUM 9.2 8.9 9.1 8.9  MG  --  2.4 2.4 2.0   Liver Function Tests: Recent Labs  Lab 09/13/21 1519  AST 18  ALT 18  ALKPHOS 94  BILITOT 0.5  PROT 7.8  ALBUMIN 4.2   CBG: Recent Labs  Lab 09/13/21 1353  GLUCAP 97    Discharge time spent: greater than 30 minutes.  Signed: 09/15/21, MD [facility] 09/16/2021

## 2021-10-13 ENCOUNTER — Ambulatory Visit: Payer: 59 | Attending: Neurology

## 2021-10-13 DIAGNOSIS — R262 Difficulty in walking, not elsewhere classified: Secondary | ICD-10-CM | POA: Insufficient documentation

## 2021-10-13 DIAGNOSIS — M6281 Muscle weakness (generalized): Secondary | ICD-10-CM | POA: Diagnosis present

## 2021-10-13 DIAGNOSIS — R2681 Unsteadiness on feet: Secondary | ICD-10-CM | POA: Diagnosis present

## 2021-10-13 NOTE — Therapy (Addendum)
OUTPATIENT PHYSICAL THERAPY NEURO EVALUATION   Patient Name: Shawn [name] MRN: [id number] DOB:07/21/1985, 36 y.o., male Today's Date: 10/13/2021   PCP: N/A REFERRING PROVIDER: Lonell Face, MD   PT End of Session - 10/13/21 0903     Visit Number 1    Number of Visits 25    Date for PT Re-Evaluation 01/05/22    PT Start Time 0806    PT Stop Time 0900    PT Time Calculation (min) 54 min    Equipment Utilized During Treatment Gait belt   Small quad cane   Activity Tolerance Patient tolerated treatment well    Behavior During Therapy Flat affect             Past Medical History:  Diagnosis Date   HTN (hypertension)    History reviewed. No pertinent surgical history. Patient Active Problem List   Diagnosis Date Noted   Essential hypertension 09/15/2021   Stroke-like symptom 09/13/2021    ONSET DATE: 09/13/21  REFERRING DIAG: Stroke (HCC)  THERAPY DIAG:  Difficulty in walking, not elsewhere classified  Muscle weakness (generalized)  Unsteadiness on feet  Rationale for Evaluation and Treatment Rehabilitation  SUBJECTIVE:                                                                                                                                                                                              SUBJECTIVE STATEMENT: Pt reports experiencing sudden onset of sharp pain in posterior neck, balance issues, and inability to walk. This led the pt to to ED after about an hour of symptom presentation and admitted for stroke work up on 09/13/21. 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 The pt received PT while in the hospital and was d/c'd on 09/16/21. Following hospital stay, pt reports dizziness/lightheadedness with quick positional changes. Pt reports continued deficits in balance, ability to walk, slight R sided lower third facial numbness, and vision changes. Was having difficulties with far sighted blurriness and double  vision in R eye, received prescription for this and issue has resolved. The pt reports SOB when walking and feeling like he is unable to walk as far and "like I normally do". Denies B&B changes, night sweats, and changes in memory and speech. Pt does report weight loss since hospital stay secondary to conscious choice of eating healthier. 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  Pt accompanied by: self  PERTINENT HISTORY: Pt reports experiencing sudden onset of sharp pain in posterior neck, balance issues, and  inability to walk. This led the pt to to ED after about an hour of symptom presentation and admitted for stroke work up on 09/13/21. 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 The pt received PT while in the hospital and was d/c'd on 09/16/21. Following hospital stay, pt reports dizziness/lightheadedness with quick positional changes. Pt reports continued deficits in balance, ability to walk, slight R sided lower third facial numbness, and vision changes. Was having difficulties with far sighted blurriness and double vision in R eye, received prescription for this and issue has resolved. The pt reports SOB when walking and feeling like he is unable to walk as far and "like I normally do". Denies B&B changes, night sweats, and changes in memory and speech. Pt does report weight loss since hospital stay secondary to conscious choice of eating healthier. 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 PMH is significant for HTN, see chart for additional information.  PAIN:  Are you having pain? No  PRECAUTIONS: Fall  WEIGHT BEARING RESTRICTIONS No  FALLS: Has patient fallen in last 6 months? No  LIVING ENVIRONMENT: Lives with: lives with their family Lives in: House/apartment Stairs:  Yes: External: 3 steps; can reach both Has following equipment at home: Quad cane small base, Walker - 4 wheeled, and shower chair  PLOF: Independent  PATIENT GOALS: "get my balance back, walking normal"  OBJECTIVE:   DIAGNOSTIC FINDINGS:  09/15/21 MR BRAIN W WO CONTRAST: "IMPRESSION: 1. 7 mm acute to early subacute ischemic nonhemorrhagic infarct involving the right dorsolateral medulla. 2. No other acute intracranial abnormality. 3. Underlying mild T2/FLAIR hyperintensity involving the periventricular white matter, nonspecific, but favored to be related to chronic microvascular ischemic disease. Small remote lacunar infarct at the posterior left frontal centrum semi ovale."  COGNITION: Overall cognitive status: Within functional limits for tasks assessed   SENSATION: WFL  COORDINATION: Chin <> Finger: WNL Heel <> Shin: WNL UE Rapid Alternating Movements: WNL    POSTURE: No Significant postural limitations  LOWER EXTREMITY MMT:   Grossly 4+/5  MMT Right Eval Left Eval  Hip flexion 4+ 4+  Hip abduction 4+ 4+  Hip adduction 4+ 4+  Knee flexion 4+ 4+  Knee extension 4+ 4+  Ankle dorsiflexion 4+ 4+  Ankle plantarflexion 4+ 4+  (Blank rows = not tested)   TRANSFERS: Assistive device utilized: Counselling psychologist  Sit to stand: Modified independence and SBA Stand to sit: Modified independence and SBA   STAIRS:  Level of Assistance: Modified independence and SBA  Stair Negotiation Technique: Alternating Pattern  with Bilateral Rails  Number of stairs: 4  GAIT: Gait pattern:  decreased cadence, decreased trunk rotation and arm swing Distance walked: 885' Assistive device utilized:  Small base quad cane in R hand Level of assistance: Modified independence and SBA Comments: reports of R leg fatigue/"feeling weak and heavy"  FUNCTIONAL TESTS:  5 times sit to stand: 13.52 sec 6 minute walk test: 885' 10 meter walk test: 0.94 m/s Dynamic Gait Index:  15/24  PATIENT SURVEYS:  ABC scale 54% FOTO 66  TODAY'S TREATMENT:  LAQ: 1 x 10 B Seated hip abd: 1 x 10 x 3 sec hold, GTB Heel raises: 1 x 15  The above exercises issued in HEP (see below)  PATIENT EDUCATION: Education details: Pt educated throughout session about proper posture and technique with outcome measures and exercises. Improved exercise  technique following use of verbal cues. SPT instructed pt on examination results, HEP, and POC.  Person educated: Patient Education method: Explanation, Demonstration, Verbal cues, and Handouts Education comprehension: verbalized understanding and returned demonstration   HOME EXERCISE PROGRAM: Access Code: [email or web address] URL: [email or web address] Date: 10/13/2021 Prepared by: Temple Pacini  Exercises - Seated Long Arc Quad  - 1 x daily - 5 x weekly - 3 sets - 15 reps - Seated Hip Abduction with Resistance  - 1 x daily - 5 x weekly - 3 sets - 15 reps - Heel Raises with Counter Support  - 1 x daily - 5 x weekly - 3 sets - 15 reps - 2 hold    GOALS: Goals reviewed with patient? No  SHORT TERM GOALS: Target date: 11/24/2021  Pt will be independent with HEP in order to improve strength, endurance, and balance in order to decrease fall risk and improve function at home and work.  Baseline: 6/28: NEW Goal status: INITIAL   LONG TERM GOALS: Target date: 01/05/2022  Pt will increase FOTO to at least 10 points in order to demonstrate improvement in mobility and QoL. Baseline: 66 Goal status: INITIAL  2.  Pt (< 60 yrs old) will decrease 5TSTS will complete 5xSTS test in < 10 seconds indicating an increase in LE strength and improved balance. Baseline: 6/28: 13.52 sec Goal status: INITIAL  3.  Pt will increase distance to at least 1200 ft in order to demonstrate improvement in cardiopulmonary endurance, community ambulation, and gait ability.  Baseline: 6/28: 885' Goal status: INITIAL  4.  Pt will increase by at  least 0.13 m/s in order to demonstrate clinically significant improvement in community ambulation.   Baseline: 6/28: 0.94 m/s Goal status: INITIAL   ASSESSMENT:  CLINICAL IMPRESSION: Patient is a 36 y.o. male who was seen today for physical therapy evaluation and treatment s/p CVA on 09/13/21. Examination reveals deficits in activity tolerance, balance, mobility, LE strength, and gait. The pt will benefit from skilled PT to address deficits, decrease fall risk, increase independence with ADLs, and improve QoL.   OBJECTIVE IMPAIRMENTS Abnormal gait, decreased activity tolerance, decreased balance, decreased coordination, decreased endurance, decreased knowledge of use of DME, decreased mobility, difficulty walking, decreased strength, dizziness, impaired sensation, impaired UE functional use, improper body mechanics, and postural dysfunction.   ACTIVITY LIMITATIONS carrying, lifting, bending, squatting, stairs, transfers, dressing, reach over head, and locomotion level  PARTICIPATION LIMITATIONS: cleaning, laundry, interpersonal relationship, shopping, community activity, occupation, and yard work  PERSONAL FACTORS Time since onset of injury/illness/exacerbation and 1 comorbidity: HTN  are also affecting patient's functional outcome.   REHAB POTENTIAL: Good  CLINICAL DECISION MAKING: Stable/uncomplicated  EVALUATION COMPLEXITY: Low  PLAN: PT FREQUENCY: 2x/week  PT DURATION: 12 weeks  PLANNED INTERVENTIONS: Therapeutic exercises, Therapeutic activity, Neuromuscular re-education, Balance training, Gait training, Patient/Family education, Joint mobilization, Stair training, Vestibular training, DME instructions, Dry Needling, Electrical stimulation, Cryotherapy, Moist heat, Manual therapy, and Re-evaluation  PLAN FOR NEXT SESSION: balance, endurance, strengthening   This entire session was performed under direct supervision and direction of a licensed therapist. I have personally read,  edited and approve of the note as written. Temple Pacini PT, DPT   Avon Gully, SPT  Baird Kay, [place] 10/13/2021, 11:51 AM

## 2021-10-18 ENCOUNTER — Ambulatory Visit: Payer: 59 | Attending: Neurology

## 2021-10-18 DIAGNOSIS — M6281 Muscle weakness (generalized): Secondary | ICD-10-CM | POA: Insufficient documentation

## 2021-10-18 DIAGNOSIS — R262 Difficulty in walking, not elsewhere classified: Secondary | ICD-10-CM | POA: Diagnosis not present

## 2021-10-18 DIAGNOSIS — R2681 Unsteadiness on feet: Secondary | ICD-10-CM | POA: Diagnosis present

## 2021-10-18 NOTE — Therapy (Signed)
OUTPATIENT PHYSICAL THERAPY NEURO TREATMENT   Patient Name: Shawn [name] MRN: [id number] DOB:[birth year], 36 y.o., male Today's Date: 10/18/2021   PCP: N/A REFERRING PROVIDER: Lonell Face, MD   PT End of Session - 10/18/21 1229     Visit Number 2    Number of Visits 25    Date for PT Re-Evaluation 01/05/22    PT Start Time 1148    PT Stop Time 1228    PT Time Calculation (min) 40 min    Equipment Utilized During Treatment Gait belt    Activity Tolerance Patient tolerated treatment well    Behavior During Therapy Flat affect             Past Medical History:  Diagnosis Date   HTN (hypertension)    History reviewed. No pertinent surgical history. Patient Active Problem List   Diagnosis Date Noted   Essential hypertension 09/15/2021   Stroke-like symptom 09/13/2021    ONSET DATE: 09/13/21  REFERRING DIAG: Stroke (HCC)  THERAPY DIAG:  Difficulty in walking, not elsewhere classified  Muscle weakness (generalized)  Unsteadiness on feet  Rationale for Evaluation and Treatment Rehabilitation  SUBJECTIVE:                                                                                                                                                                                              SUBJECTIVE STATEMENT: Pt reports no pain or soreness today; felt ok after last session. Wants to continue working on walking without QC and getting back to being fully independent. Pt reports HEP is easy.  Pt accompanied by: self  PERTINENT HISTORY: Pt reports experiencing sudden onset of sharp pain in posterior neck, balance issues, and inability to walk. This led the pt to to ED after about an hour of symptom presentation and admitted for stroke work up on 09/13/21. 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 The pt received PT while in the hospital and was d/c'd on 09/16/21. Following hospital stay, pt reports dizziness/lightheadedness with  quick positional changes. Pt reports continued deficits in balance, ability to walk, slight R sided lower third facial numbness, and vision changes. Was having difficulties with far sighted blurriness and double vision in R eye, received prescription for this and issue has resolved. The pt reports SOB when walking and feeling like he is unable to walk as far and "like I normally do". Denies B&B changes, night sweats, and changes in memory and speech. Pt does report weight loss since hospital stay secondary to conscious choice of eating healthier. 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 PMH is significant for HTN, see chart for additional information.  PAIN:  Are you having pain? No  PRECAUTIONS: Fall  WEIGHT BEARING RESTRICTIONS No  FALLS: Has patient fallen in last 6 months? No  LIVING ENVIRONMENT: Lives with: lives with their family Lives in: House/apartment Stairs: Yes: External: 3 steps; can reach both Has following equipment at home: Quad cane small base, Walker - 4 wheeled, and shower chair  PLOF: Independent  PATIENT GOALS: "get my balance back, walking normal"  OBJECTIVE:   LOWER EXTREMITY MMT:   Grossly 4+/5  MMT Right Eval Left Eval  Hip flexion 4+ 4+  Hip abduction 4+ 4+  Hip adduction 4+ 4+  Knee flexion 4+ 4+  Knee extension 4+ 4+  Ankle dorsiflexion 4+ 4+  Ankle plantarflexion 4+ 4+  (Blank rows = not tested)   TODAY'S TREATMENT:  - Gait with 1.5# AW  - high knee marching, 90ft  - high knee marching with alt trunk rotation, 8ft  - side stepping with emphasis on marching, 11ft x2 - ladder drills with 1.5# AW  - forward/backwards, in/out, high knee/quick feet, x2ea - wood chops with blue medicine ball, 2x10 bil - squat with OH press, blue medicine ball, 2x10 - SLS on airex, shooting hoops with red medicine ball, x10ea - tall kneeling paloff press on airex pad  22.5# bil, with multidirectional perturbations at shoulders, ribs, and hips - standing balance on tilt board both directions, each, with multidirectional perturbations at shoulders, ribs, and hips - 4 way resisted walk outs at cable column, 32.5#, x4 each direction with CGA   PATIENT EDUCATION: Education details: Pt educated throughout session regarding activity pacing, monitoring RLE fatigue for reduced fall risk, balance activities at home Person educated: Patient Education method: Programmer, multimedia, Facilities manager, Verbal cues, and Handouts Education comprehension: verbalized understanding and returned demonstration   HOME EXERCISE PROGRAM: Access Code: [email or web address] URL: [email or web address] Date: 10/13/2021 Prepared by: Temple Pacini  Exercises - Seated Long Arc Quad  - 1 x daily - 5 x weekly - 3 sets - 15 reps - Seated Hip Abduction with Resistance  - 1 x daily - 5 x weekly - 3 sets - 15 reps - Heel Raises with Counter Support  - 1 x daily - 5 x weekly - 3 sets - 15 reps - 2 hold - added SLS at home for a total of 5 minutes on each side; encouraged to balance until failure    GOALS: Goals reviewed with patient? No  SHORT TERM GOALS: Target date: 11/29/2021  Pt will be independent with HEP in order to improve strength, endurance, and balance in order to decrease fall risk and improve function at home and work.  Baseline: 6/28: NEW Goal status: INITIAL   LONG TERM GOALS: Target date: 01/10/2022  Pt will increase FOTO to at least 10 points in order to demonstrate improvement in mobility and QoL. Baseline: 66 Goal status: INITIAL  2.  Pt (< 60 yrs old) will decrease 5TSTS will complete 5xSTS test in < 10 seconds indicating an increase in LE strength and improved balance. Baseline: 6/28: 13.52 sec Goal status: INITIAL  3.  Pt will increase distance to at least 1200 ft in order to demonstrate improvement in cardiopulmonary endurance, community ambulation, and  gait ability.  Baseline: 6/28: 885' Goal status: INITIAL  4.  Pt will increase by at least 0.13 m/s in order to demonstrate clinically  significant improvement in community ambulation.   Baseline: 6/28: 0.94 m/s Goal status: INITIAL   ASSESSMENT:  CLINICAL IMPRESSION: Pt tolerated increased intensity of interventions well today, with increased c/o RLE fatigue at end of session. Treatment focused on functional activities with emphasis on balance reactive strategies. Pt with increased difficulty RLE>LLE with SLS and ball toss on airex pad, requiring increased support from LLE. Demonstrates fair proximal stability with multidirectional perturbations, and will continue to benefit from this intervention in a functional capacity for reduced fall risk. Incorporated floor>OH lifting to mimic job duties. Pt will continue to benefit from functionally based interventions incorporating weight/resistance and cross body movements, for return to baseline function.   OBJECTIVE IMPAIRMENTS Abnormal gait, decreased activity tolerance, decreased balance, decreased coordination, decreased endurance, decreased knowledge of use of DME, decreased mobility, difficulty walking, decreased strength, dizziness, impaired sensation, impaired UE functional use, improper body mechanics, and postural dysfunction.   ACTIVITY LIMITATIONS carrying, lifting, bending, squatting, stairs, transfers, dressing, reach over head, and locomotion level  PARTICIPATION LIMITATIONS: cleaning, laundry, interpersonal relationship, shopping, community activity, occupation, and yard work  PERSONAL FACTORS Time since onset of injury/illness/exacerbation and 1 comorbidity: HTN  are also affecting patient's functional outcome.   REHAB POTENTIAL: Good  CLINICAL DECISION MAKING: Stable/uncomplicated  EVALUATION COMPLEXITY: Low  PLAN: PT FREQUENCY: 2x/week  PT DURATION: 12 weeks  PLANNED INTERVENTIONS: Therapeutic exercises,  Therapeutic activity, Neuromuscular re-education, Balance training, Gait training, Patient/Family education, Joint mobilization, Stair training, Vestibular training, DME instructions, Dry Needling, Electrical stimulation, Cryotherapy, Moist heat, Manual therapy, and Re-evaluation  PLAN FOR NEXT SESSION: balance, endurance, strengthening   Herminio Commons, PT, DPT 12:56 PM,10/18/21

## 2021-10-20 ENCOUNTER — Ambulatory Visit: Payer: 59 | Admitting: Physical Therapy

## 2021-10-20 DIAGNOSIS — R262 Difficulty in walking, not elsewhere classified: Secondary | ICD-10-CM

## 2021-10-20 DIAGNOSIS — R2681 Unsteadiness on feet: Secondary | ICD-10-CM

## 2021-10-20 DIAGNOSIS — M6281 Muscle weakness (generalized): Secondary | ICD-10-CM

## 2021-10-20 NOTE — Therapy (Signed)
OUTPATIENT PHYSICAL THERAPY NEURO TREATMENT   Patient Name: Shawn [name] MRN: [id number] DOB:12/27/1985, 36 y.o., male Today's Date: 10/20/2021   PCP: N/A REFERRING PROVIDER: Lonell Face, MD   PT End of Session - 10/20/21 1514     Visit Number 3    Number of Visits 25    Date for PT Re-Evaluation 01/05/22    PT Start Time 1515    PT Stop Time 1600    PT Time Calculation (min) 45 min    Equipment Utilized During Treatment Gait belt    Activity Tolerance Patient tolerated treatment well    Behavior During Therapy Flat affect             Past Medical History:  Diagnosis Date   HTN (hypertension)    No past surgical history on file. Patient Active Problem List   Diagnosis Date Noted   Essential hypertension 09/15/2021   Stroke-like symptom 09/13/2021    ONSET DATE: 09/13/21  REFERRING DIAG: Stroke (HCC)  THERAPY DIAG:  Difficulty in walking, not elsewhere classified  Muscle weakness (generalized)  Unsteadiness on feet  Rationale for Evaluation and Treatment Rehabilitation  SUBJECTIVE:                                                                                                                                                                                              SUBJECTIVE STATEMENT: Pt reports no significant changes since last session. Pt has some cramping pain in his R calf area but no pain noted.   Pt accompanied by: self  PERTINENT HISTORY: Pt reports experiencing sudden onset of sharp pain in posterior neck, balance issues, and inability to walk. This led the pt to to ED after about an hour of symptom presentation and admitted for stroke work up on 09/13/21. 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 The pt received PT while in the hospital and was d/c'd on 09/16/21. Following hospital stay, pt reports dizziness/lightheadedness with quick positional changes. Pt reports continued deficits in balance, ability to  walk, slight R sided lower third facial numbness, and vision changes. Was having difficulties with far sighted blurriness and double vision in R eye, received prescription for this and issue has resolved. The pt reports SOB when walking and feeling like he is unable to walk as far and "like I normally do". Denies B&B changes, night sweats, and changes in memory and speech. Pt does report weight loss since hospital stay secondary to conscious choice of eating healthier. 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 PMH is significant for HTN, see chart for additional information.  PAIN:  Are you having pain? No  PRECAUTIONS: Fall  WEIGHT BEARING RESTRICTIONS No  FALLS: Has patient fallen in last 6 months? No  LIVING ENVIRONMENT: Lives with: lives with their family Lives in: House/apartment Stairs: Yes: External: 3 steps; can reach both Has following equipment at home: Quad cane small base, Walker - 4 wheeled, and shower chair  PLOF: Independent  PATIENT GOALS: "get my balance back, walking normal"  OBJECTIVE: (objective measures completed at initial evaluation unless otherwise dated)   LOWER EXTREMITY MMT:   Grossly 4+/5  MMT Right Eval Left Eval  Hip flexion 4+ 4+  Hip abduction 4+ 4+  Hip adduction 4+ 4+  Knee flexion 4+ 4+  Knee extension 4+ 4+  Ankle dorsiflexion 4+ 4+  Ankle plantarflexion 4+ 4+  (Blank rows = not tested)   TODAY'S TREATMENT:    Exercise/Activity Sets/Reps/Time/ Resistance Assistance Charge type Comments  Standing gastroc stretch  2 x 45 sec ea LE  therex To improve subjective rating of tightness noted in calves bilaterally   Ladder drills: forward backwards, 1 foot ea, 2 foot ea, lateral, fast feet, in and out  X 2 ea  Cues for proper performance  therex Difficulty with coordination at times, particularly when leading with L LE.   4 way resisted walk on cable  column  X 4 ea   nmr Added obstacles ( airex pads, red mat) for increasing challenge of balance task  SLS on airex with basketball toss/ pass  X 1 min L, 2 x 1 min R   NMR Increased difficulty on the right side with SLS  Walking 2# AW X 150 ft   therex   Sidestepping 2# AW in mini squat position  X 40 feet ea way   therex   High marching 2# AW  X 80 feet   therex   HS curl walking 2 # AW  X 80 feet   therex                     Treatment Provided this session    Pt educated throughout session about proper posture and technique with exercises. Improved exercise technique, movement at target joints, use of target muscles after min to mod verbal, visual, tactile cues. Note: Portions of this document were prepared using Dragon voice recognition software and although reviewed may contain unintentional dictation errors in syntax, grammar, or spelling.  PATIENT EDUCATION: Education details: Pt educated throughout session regarding activity pacing, monitoring RLE fatigue for reduced fall risk, balance activities at home Person educated: Patient Education method: Programmer, multimedia, Facilities manager, Verbal cues, and Handouts Education comprehension: verbalized understanding and returned demonstration   HOME EXERCISE PROGRAM: Access Code: [email or web address] URL: [email or web address] Date: 10/13/2021 Prepared by: Temple Pacini  Exercises - Seated Long Arc Quad  - 1 x daily - 5 x weekly - 3 sets - 15 reps - Seated Hip Abduction with Resistance  - 1 x daily - 5 x weekly - 3 sets - 15 reps - Heel Raises with Counter Support  - 1 x daily - 5 x weekly - 3 sets - 15 reps - 2 hold - added SLS at home for a total of 5 minutes on each side; encouraged to balance until failure    GOALS: Goals reviewed with patient? No  SHORT TERM GOALS: Target date: 12/01/2021  Pt will be independent with HEP in order to improve  strength, endurance, and balance in order to decrease fall risk and improve function at home  and work.  Baseline: 6/28: NEW Goal status: INITIAL   LONG TERM GOALS: Target date: 01/12/2022  Pt will increase FOTO to at least 10 points in order to demonstrate improvement in mobility and QoL. Baseline: 66 Goal status: INITIAL  2.  Pt (< 60 yrs old) will decrease 5TSTS will complete 5xSTS test in < 10 seconds indicating an increase in LE strength and improved balance. Baseline: 6/28: 13.52 sec Goal status: INITIAL  3.  Pt will increase distance to at least 1200 ft in order to demonstrate improvement in cardiopulmonary endurance, community ambulation, and gait ability.  Baseline: 6/28: 885' Goal status: INITIAL  4.  Pt will increase by at least 0.13 m/s in order to demonstrate clinically significant improvement in community ambulation.   Baseline: 6/28: 0.94 m/s Goal status: INITIAL   ASSESSMENT:  CLINICAL IMPRESSION: Continued with current plan of care as laid out in evaluation and recent prior sessions. Pt remains motivated to advance progress toward goals in order to maximize independence and safety at home. Pt requires high level assistance and cuing for completion of exercises in order to provide adequate level of stimulation and perturbation. Author allows pt as much opportunity as possible to perform independent righting strategies, only stepping in when pt is unable to prevent falling to floor. Increased challenge with dynamic surfaces today and pt continues to have difficulty with single leg balance tasks at this time.  Pt continues to demonstrate progress toward goals AEB progression of some interventions this date either in volume or intensity.    OBJECTIVE IMPAIRMENTS Abnormal gait, decreased activity tolerance, decreased balance, decreased coordination, decreased endurance, decreased knowledge of use of DME, decreased mobility, difficulty walking, decreased strength, dizziness, impaired sensation, impaired UE functional use, improper body mechanics, and  postural dysfunction.   ACTIVITY LIMITATIONS carrying, lifting, bending, squatting, stairs, transfers, dressing, reach over head, and locomotion level  PARTICIPATION LIMITATIONS: cleaning, laundry, interpersonal relationship, shopping, community activity, occupation, and yard work  PERSONAL FACTORS Time since onset of injury/illness/exacerbation and 1 comorbidity: HTN  are also affecting patient's functional outcome.   REHAB POTENTIAL: Good  CLINICAL DECISION MAKING: Stable/uncomplicated  EVALUATION COMPLEXITY: Low  PLAN: PT FREQUENCY: 2x/week  PT DURATION: 12 weeks  PLANNED INTERVENTIONS: Therapeutic exercises, Therapeutic activity, Neuromuscular re-education, Balance training, Gait training, Patient/Family education, Joint mobilization, Stair training, Vestibular training, DME instructions, Dry Needling, Electrical stimulation, Cryotherapy, Moist heat, Manual therapy, and Re-evaluation  PLAN FOR NEXT SESSION: balance, endurance, strengthening   Thresa Ross PT, DPT

## 2021-10-26 ENCOUNTER — Ambulatory Visit: Payer: 59 | Admitting: Physical Therapy

## 2021-10-26 VITALS — BP 159/107

## 2021-10-26 DIAGNOSIS — R262 Difficulty in walking, not elsewhere classified: Secondary | ICD-10-CM

## 2021-10-26 DIAGNOSIS — M6281 Muscle weakness (generalized): Secondary | ICD-10-CM

## 2021-10-26 DIAGNOSIS — R2681 Unsteadiness on feet: Secondary | ICD-10-CM

## 2021-10-26 NOTE — Therapy (Signed)
[facility] MAIN [facility] SERVICES [address] [place], [place], [address] Phone: [phone]   Fax:  [phone]  Patient Details  Name: Shawn [name] MRN: [id number] Date of Birth: October 17, 1985 Referring Provider:  Lonell Face, MD  Encounter Date: 10/26/2021  Treatment held this date secondary to elevated diastolic blood pressure.  Patient's blood pressure was measured in clinic at 159/107.  Patient did report he is under a lot of stress last night as his dog ran away and also his dogs were barking keeping him up throughout the night.  Patient also reports he is not been well-hydrated over the last day.  Patient instructed to monitor his blood pressure throughout the afternoon and his blood pressure is not improved this afternoon he is instructed to contact his Dr. Dorothey Baseman regarding further continuation of care.  Patient also warned regarding signs and symptoms of stroke and to be aware of these symptoms if they are to occur to seek medical care immediately if the symptoms present themselves.  Patient comfortable with this plan and verbalizes understanding.  No charge associated with this treatment session.  Norman Herrlich, PT 10/26/2021, 10:00 AM  [facility] MAIN [facility] SERVICES [address] [place], [place], [address] Phone: [phone]   Fax:  [phone]

## 2021-10-28 ENCOUNTER — Ambulatory Visit: Payer: 59 | Admitting: Physical Therapy

## 2021-10-28 VITALS — BP 174/120 | HR 77

## 2021-10-28 DIAGNOSIS — R262 Difficulty in walking, not elsewhere classified: Secondary | ICD-10-CM

## 2021-10-28 DIAGNOSIS — R2681 Unsteadiness on feet: Secondary | ICD-10-CM

## 2021-10-28 DIAGNOSIS — M6281 Muscle weakness (generalized): Secondary | ICD-10-CM

## 2021-10-28 NOTE — Therapy (Signed)
[facility] MAIN [facility] SERVICES [address] [place], [place], [address] Phone: [phone]   Fax:  [phone]  Patient Details  Name: Shawn [name] MRN: [id number] Date of Birth: [birth year] Referring Provider:  Lonell Face, MD  Encounter Date: 10/28/2021  Pt presents with continued high blood pressure this date. Pt PCP contacted via telephone and informed of increased BP readings. MD not able to call back when pt was in clinic and pt instructed call would be placed to him from MD when MD contacted office. Pt given choice to wait in or office for phone call, or we would instruct MD office to call him upon receipt of call.  Pt informed of s/s of stroke with handout from [employer] describing FAST as well as other s/s to be aware of. Pt verbalizes understanding of importance of getting BP under control, even if it is just in the AM.   No PT charges associated with this visit.   Norman Herrlich, PT 10/28/2021, 10:09 AM  [facility] MAIN [facility] SERVICES [address] [place], [place], [address] Phone: [phone]   Fax:  [phone]

## 2021-11-02 ENCOUNTER — Ambulatory Visit: Payer: 59 | Admitting: Physical Therapy

## 2021-11-04 ENCOUNTER — Ambulatory Visit: Payer: 59

## 2021-11-04 DIAGNOSIS — M6281 Muscle weakness (generalized): Secondary | ICD-10-CM

## 2021-11-04 DIAGNOSIS — R262 Difficulty in walking, not elsewhere classified: Secondary | ICD-10-CM

## 2021-11-04 DIAGNOSIS — R2681 Unsteadiness on feet: Secondary | ICD-10-CM

## 2021-11-04 NOTE — Therapy (Signed)
OUTPATIENT PHYSICAL THERAPY NEURO TREATMENT   Patient Name: Shawn [name] MRN: [id number] DOB:[birth year], 36 y.o., male Today's Date: 11/04/2021   PCP: N/A REFERRING PROVIDER: Lonell Face, MD   PT End of Session - 11/04/21 1026     Visit Number 4    Number of Visits 25    Date for PT Re-Evaluation 01/05/22    Authorization Type [employer]    Authorization Time Period ?    Progress Note Due on Visit 10    PT Start Time 1022    PT Stop Time 1052    PT Time Calculation (min) 30 min    Equipment Utilized During Treatment Gait belt    Activity Tolerance Treatment limited secondary to medical complications (Comment)    Behavior During Therapy WFL for tasks assessed/performed             Past Medical History:  Diagnosis Date   HTN (hypertension)    No past surgical history on file. Patient Active Problem List   Diagnosis Date Noted   Essential hypertension 09/15/2021   Stroke-like symptom 09/13/2021    ONSET DATE: 09/13/21  REFERRING DIAG: Stroke (HCC)  THERAPY DIAG:  Difficulty in walking, not elsewhere classified  Muscle weakness (generalized)  Unsteadiness on feet  Rationale for Evaluation and Treatment Rehabilitation  SUBJECTIVE:                                                                                                                                                                                              SUBJECTIVE STATEMENT: Still waiting to hear back from PCP regarding BP issues. Has not gotten a response. 5/10 HA today   Pt accompanied by: self  PERTINENT HISTORY: Pt reports experiencing sudden onset of sharp pain in posterior neck, balance issues, and inability to walk. This led the pt to to ED after about an hour of symptom presentation and admitted for stroke work up on 09/13/21. 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 The pt received PT while in the hospital and was d/c'd on 09/16/21. Following  hospital stay, pt reports dizziness/lightheadedness with quick positional changes. Pt reports continued deficits in balance, ability to walk, slight R sided lower third facial numbness, and vision changes. Was having difficulties with far sighted blurriness and double vision in R eye, received prescription for this and issue has resolved. The pt reports SOB when walking and feeling like he is unable to walk as far and "like I normally do". Denies B&B changes, night sweats, and changes in memory and speech. Pt does report weight loss since hospital stay secondary  to conscious choice of eating healthier. 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 PMH is significant for HTN, see chart for additional information.  PAIN:  Are you having pain? No  PRECAUTIONS: Fall  WEIGHT BEARING RESTRICTIONS No  FALLS: Has patient fallen in last 6 months? No  LIVING ENVIRONMENT: Lives with: lives with their family Lives in: House/apartment Stairs: Yes: External: 3 steps; can reach both Has following equipment at home: Quad cane small base, Walker - 4 wheeled, and shower chair  PLOF: Independent  PATIENT GOALS: "get my balance back, walking normal"  OBJECTIVE: (objective measures completed at initial evaluation unless otherwise dated)   LOWER EXTREMITY MMT:   Grossly 5/5  MMT Right Eval Left Eval  Hip flexion 5 5  Hip abduction 5 5  Hip adduction 5 5  Knee flexion 5 5  Knee extension 5 5  Ankle dorsiflexion 5 5  Ankle plantarflexion    (Blank rows = not tested)  -10RM squat from 12" box (symmetrical), 10RM knee flexion 27.5lb (=bilat)   TODAY'S TREATMENT:  BP: 145/170mmHg Large Adult cuff used  - : 1260ft (no significant change)  -10RM squat from 12" box, 10RM cable HS curls 27.5lb (equal bilat)  -squat jump c 90 degree rotation, alternating directions, 1x16 -heavybag lift and toss (work simulation)  x10 (reports to feel much closer to his PLOF than anticipated, but bag is somewhat lighter)     Treatment Provided this session    Pt educated throughout session about proper posture and technique with exercises. Improved exercise technique, movement at target joints, use of target muscles after min to mod verbal, visual, tactile cues. Note: Portions of this document were prepared using Dragon voice recognition software and although reviewed may contain unintentional dictation errors in syntax, grammar, or spelling.  PATIENT EDUCATION: Education details: Pt educated throughout session regarding activity pacing, monitoring RLE fatigue for reduced fall risk, balance activities at home Person educated: Patient Education method: Programmer, multimedia, Facilities manager, Verbal cues, and Handouts Education comprehension: verbalized understanding and returned demonstration   HOME EXERCISE PROGRAM: Access Code: [email or web address] URL: [email or web address] Date: 10/13/2021 Prepared by: Temple Pacini  Exercises - Seated Long Arc Quad  - 1 x daily - 5 x weekly - 3 sets - 15 reps - Seated Hip Abduction with Resistance  - 1 x daily - 5 x weekly - 3 sets - 15 reps - Heel Raises with Counter Support  - 1 x daily - 5 x weekly - 3 sets - 15 reps - 2 hold - added SLS at home for a total of 5 minutes on each side; encouraged to balance until failure 11/04/21: -5 minutes moderate walkinjg 3-5x daily   GOALS: Goals reviewed with patient? No  SHORT TERM GOALS: Target date: 12/16/2021  Pt will be independent with HEP in order to improve strength, endurance, and balance in order to decrease fall risk and improve function at home and work.  Baseline: 6/28: NEW Goal status: INITIAL   LONG TERM GOALS: Target date: 01/27/2022  Pt will increase FOTO to at least 10 points in order to demonstrate improvement in mobility and QoL. Baseline: 66 Goal status: INITIAL  2.  Pt (< 60 yrs old) will decrease 5TSTS will  complete 5xSTS test in < 10 seconds indicating an increase in LE strength and improved balance. Baseline: 6/28: 13.52 sec Goal status: INITIAL Consider future 30sec chair rise, more age appropriate   3.  Pt will increase distance to at least 1200 ft in order to demonstrate improvement in cardiopulmonary endurance, community ambulation, and gait ability.  Baseline: 6/28: 885' Goal status: INITIAL  4.  Pt will increase by at least 0.13 m/s in order to demonstrate clinically significant improvement in community ambulation.   Baseline: 6/28: 0.94 m/s Goal status: INITIAL   ASSESSMENT:  CLINICAL IMPRESSION: Continued with current plan of care as laid out in evaluation and recent prior sessions. Pt remains motivated to advance progress toward goals in order to maximize independence and safety at home. Pt requires high level assistance and cuing for completion of exercises in order to provide adequate level of stimulation and perturbation. Author allows pt as much opportunity as possible to perform independent righting strategies, only stepping in when pt is unable to prevent falling to floor. Retested MMT, all 5/5, about the same, encouraged to walk more daily. Pt still limited by centrla fatigue more quickly but difficult to objectify. His quat jumpts begin to show Rt leg avoidance when power is increased. BP better controlled today with use of adult large cuff, normal size borderline too small, suspect giving falsely high DB. Pt continues to demonstrate progress toward goals AEB progression of some interventions this date either in volume or intensity.    OBJECTIVE IMPAIRMENTS Abnormal gait, decreased activity tolerance, decreased balance, decreased coordination, decreased endurance, decreased knowledge of use of DME, decreased mobility, difficulty walking, decreased strength, dizziness, impaired sensation, impaired UE functional use, improper body mechanics, and postural  dysfunction.   ACTIVITY LIMITATIONS carrying, lifting, bending, squatting, stairs, transfers, dressing, reach over head, and locomotion level  PARTICIPATION LIMITATIONS: cleaning, laundry, interpersonal relationship, shopping, community activity, occupation, and yard work  PERSONAL FACTORS Time since onset of injury/illness/exacerbation and 1 comorbidity: HTN  are also affecting patient's functional outcome.   REHAB POTENTIAL: Good  CLINICAL DECISION MAKING: Stable/uncomplicated  EVALUATION COMPLEXITY: Low  PLAN: PT FREQUENCY: 2x/week  PT DURATION: 12 weeks  PLANNED INTERVENTIONS: Therapeutic exercises, Therapeutic activity, Neuromuscular re-education, Balance training, Gait training, Patient/Family education, Joint mobilization, Stair training, Vestibular training, DME instructions, Dry Needling, Electrical stimulation, Cryotherapy, Moist heat, Manual therapy, and Re-evaluation  PLAN FOR NEXT SESSION: balance, endurance, strengthening     10:50 AM, 11/04/21 Rosamaria Lints, PT, DPT Physical Therapist - [facility]  Outpatient Physical Therapy- Main Campus [phone]

## 2021-11-09 ENCOUNTER — Ambulatory Visit: Payer: 59 | Admitting: Physical Therapy

## 2021-11-09 VITALS — BP 145/106

## 2021-11-09 DIAGNOSIS — R2681 Unsteadiness on feet: Secondary | ICD-10-CM

## 2021-11-09 DIAGNOSIS — M6281 Muscle weakness (generalized): Secondary | ICD-10-CM

## 2021-11-09 DIAGNOSIS — R262 Difficulty in walking, not elsewhere classified: Secondary | ICD-10-CM

## 2021-11-09 NOTE — Therapy (Signed)
OUTPATIENT PHYSICAL THERAPY NEURO TREATMENT   Patient Name: Shawn [name] MRN: [id number] DOB:11-Dec-1985, 36 y.o., male Today's Date: 11/09/2021   PCP: N/A REFERRING PROVIDER: Lonell Face, MD   PT End of Session - 11/09/21 1113     Visit Number 5    Number of Visits 25    Date for PT Re-Evaluation 01/05/22    Authorization Type [employer]    Authorization Time Period No auth required    Progress Note Due on Visit 10    PT Start Time [phone]    PT Stop Time 0930    PT Time Calculation (min) 43 min    Equipment Utilized During Treatment Gait belt    Activity Tolerance Treatment limited secondary to medical complications (Comment)    Behavior During Therapy WFL for tasks assessed/performed              Past Medical History:  Diagnosis Date   HTN (hypertension)    No past surgical history on file. Patient Active Problem List   Diagnosis Date Noted   Essential hypertension 09/15/2021   Stroke-like symptom 09/13/2021    ONSET DATE: 09/13/21  REFERRING DIAG: Stroke [facility])  THERAPY DIAG:  Difficulty in walking, not elsewhere classified  Muscle weakness (generalized)  Unsteadiness on feet  Rationale for Evaluation and Treatment Rehabilitation  SUBJECTIVE:                                                                                                                                                                                              SUBJECTIVE STATEMENT: Pt reports having scheduled an MD appointment on Friday. 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 This led the pt to to ED after about an hour of symptom presentation and admitted for stroke work up on 09/13/21. 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 The pt received PT while in the hospital and was d/c'd on 09/16/21. Following hospital stay, pt reports dizziness/lightheadedness with quick positional changes. Pt reports continued deficits in balance, ability to walk, slight R sided lower third facial numbness, and vision changes. Was having difficulties with far sighted blurriness and double vision in R eye, received prescription for this and issue has resolved. The pt reports SOB when walking and feeling like he is unable to walk as far and "like I normally do". Denies B&B  changes, night sweats, and changes in memory and speech. Pt does report weight loss since hospital stay secondary to conscious choice of eating healthier. 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 PMH is significant for HTN, see chart for additional information.  PAIN:  Are you having pain? No  PRECAUTIONS: Fall  WEIGHT BEARING RESTRICTIONS No  FALLS: Has patient fallen in last 6 months? No  LIVING ENVIRONMENT: Lives with: lives with their family Lives in: House/apartment Stairs: Yes: External: 3 steps; can reach both Has following equipment at home: Quad cane small base, Walker - 4 wheeled, and shower chair  PLOF: Independent  PATIENT GOALS: "get my balance back, walking normal"  OBJECTIVE: (objective measures completed at initial evaluation unless otherwise dated)   LOWER EXTREMITY MMT:   Grossly 5/5  MMT Right Eval Left Eval  Hip flexion 5 5  Hip abduction 5 5  Hip adduction 5 5  Knee flexion 5 5  Knee extension 5 5  Ankle dorsiflexion 5 5  Ankle plantarflexion    (Blank rows = not tested)  -10RM squat from 12" box (symmetrical), 10RM knee flexion 27.5lb (=bilat)   TODAY'S TREATMENT:  BP: 145/106 mmHg Large Adult cuff used  -Leg press x 10 x 85 easy   -x 10 x 115# : moderate to easy   X10 x 130# -moderate  2 X 10 x 175# - moderate to  hard   Outdoor circuit training for return to high intensity work activity, outdoor temp around 85 degrees to simulate work environment.  -x approximately 300 feet with 2 sets of 12 jump squats and 10 forward lunges each LE - completed 2 sets of this circuit  -Kettle bell lift and set down to side to simulate work related lifting and sandbag tossing   2 x 10 each direction   Treatment Provided this session   Pt educated throughout session about proper posture and technique with exercises. Improved exercise technique, movement at target joints, use of target muscles after min to mod verbal, visual, tactile cues. Note: Portions of this document were prepared using Dragon voice recognition software and although reviewed may contain unintentional dictation errors in syntax, grammar, or spelling.  PATIENT EDUCATION: Education details: Pt educated throughout session regarding activity pacing, monitoring RLE fatigue for reduced fall risk, balance activities at home Person educated: Patient Education method: Consulting civil engineer, Media planner, Verbal cues, and Handouts Education comprehension: verbalized understanding and returned demonstration   HOME EXERCISE PROGRAM: Access Code: [email or web address] URL: [email or web address] Date: 10/13/2021 Prepared by: Ricard Dillon  Exercises - Seated Long Arc Quad  - 1 x daily - 5 x weekly - 3 sets - 15 reps - Seated Hip Abduction with Resistance  - 1 x daily - 5 x weekly - 3 sets - 15 reps - Heel Raises with Counter Support  - 1 x daily - 5 x weekly - 3 sets - 15 reps - 2 hold - added SLS at home for a total of 5 minutes on each side; encouraged to balance until failure 11/04/21: -5 minutes moderate walkinjg 3-5x daily   GOALS: Goals reviewed with patient? No  SHORT TERM GOALS: Target date: 12/21/2021  Pt will be independent with HEP in order to improve strength, endurance, and balance in order to decrease fall risk and improve function at home and  work.  Baseline: 6/28: NEW Goal status: INITIAL   LONG TERM GOALS: Target date: 02/01/2022  Pt will increase FOTO to at least 10 points in order to demonstrate improvement in mobility and QoL. Baseline: 66 Goal status: INITIAL  2.  Pt (< 60 yrs old) will decrease 5TSTS will complete 5xSTS test in < 10 seconds indicating an increase in LE strength and improved balance. Baseline: 6/28: 13.52 sec Goal status: INITIAL Consider future 30sec chair rise, more age appropriate   3.  Pt will increase distance to at least 1200 ft in order to demonstrate improvement in cardiopulmonary endurance, community ambulation, and gait ability.  Baseline: 6/28: 885' Goal status: INITIAL  4.  Pt will increase by at least 0.13 m/s in order to demonstrate clinically significant improvement in community ambulation.   Baseline: 6/28: 0.94 m/s Goal status: INITIAL   ASSESSMENT:  CLINICAL IMPRESSION: Continued with current plan of care as laid out in evaluation and recent prior sessions. Pt remains motivated to advance progress toward goals in order to maximize independence and safety at home. Pt requires high level assistance and cuing for completion of exercises in order to provide adequate level of stimulation and perturbation. Author allows pt as much opportunity as possible to perform independent righting strategies, only stepping in when pt is unable to prevent falling to floor.  Patient progressed with outdoor intensity exercise to simulate work environment and overall tolerated well although he did have increased fatigue noted.  Patient also complete several work simulation activities as well as higher intensity lower extremity strengthening with good response.  Patient is still waiting to see Dr. Regarding further medication management for his high blood pressure is scheduled to see Dr. Sharen Hones Friday.Pt will continue to benefit from skilled physical therapy intervention to address impairments,  improve QOL, and attain therapy goals.     OBJECTIVE IMPAIRMENTS Abnormal gait, decreased activity tolerance, decreased balance, decreased coordination, decreased endurance, decreased knowledge of use of DME, decreased mobility, difficulty walking, decreased strength, dizziness, impaired sensation, impaired UE functional use, improper body mechanics, and postural dysfunction.   ACTIVITY LIMITATIONS carrying, lifting, bending, squatting, stairs, transfers, dressing, reach over head, and locomotion level  PARTICIPATION LIMITATIONS: cleaning, laundry, interpersonal relationship, shopping, community activity, occupation, and yard work  PERSONAL FACTORS Time since onset of injury/illness/exacerbation and 1 comorbidity: HTN  are also affecting patient's functional outcome.   REHAB POTENTIAL: Good  CLINICAL DECISION MAKING: Stable/uncomplicated  EVALUATION COMPLEXITY: Low  PLAN: PT FREQUENCY: 2x/week  PT DURATION: 12 weeks  PLANNED INTERVENTIONS: Therapeutic exercises, Therapeutic activity, Neuromuscular re-education, Balance training, Gait training, Patient/Family education, Joint mobilization, Stair training, Vestibular training, DME instructions, Dry Needling, Electrical stimulation, Cryotherapy, Moist heat, Manual therapy, and Re-evaluation  PLAN FOR NEXT SESSION: balance, endurance, strengthening     11:17 AM, 11/09/21 Thresa Ross PT, DPT

## 2021-11-11 ENCOUNTER — Ambulatory Visit: Payer: 59 | Admitting: Physical Therapy

## 2021-11-11 ENCOUNTER — Encounter: Payer: Self-pay | Admitting: Physical Therapy

## 2021-11-11 VITALS — BP 159/107

## 2021-11-11 DIAGNOSIS — M6281 Muscle weakness (generalized): Secondary | ICD-10-CM

## 2021-11-11 DIAGNOSIS — R262 Difficulty in walking, not elsewhere classified: Secondary | ICD-10-CM

## 2021-11-11 DIAGNOSIS — R2681 Unsteadiness on feet: Secondary | ICD-10-CM

## 2021-11-11 NOTE — Therapy (Signed)
OUTPATIENT PHYSICAL THERAPY NEURO TREATMENT   Patient Name: Shawn [name] MRN: [id number] DOB:08/23/1985, 36 y.o., male Today's Date: 11/11/2021   PCP: N/A REFERRING PROVIDER: Lonell Face, MD   PT End of Session - 11/11/21 0942     Visit Number 6    Number of Visits 25    Date for PT Re-Evaluation 01/05/22    Authorization Type [employer]    Authorization Time Period No auth required    Progress Note Due on Visit 10    PT Start Time 0934    PT Stop Time 1015    PT Time Calculation (min) 41 min    Equipment Utilized During Treatment Gait belt    Activity Tolerance Treatment limited secondary to medical complications (Comment)    Behavior During Therapy WFL for tasks assessed/performed               Past Medical History:  Diagnosis Date   HTN (hypertension)    History reviewed. No pertinent surgical history. Patient Active Problem List   Diagnosis Date Noted   Essential hypertension 09/15/2021   Stroke-like symptom 09/13/2021    ONSET DATE: 09/13/21  REFERRING DIAG: Stroke [facility])  THERAPY DIAG:  Difficulty in walking, not elsewhere classified  Muscle weakness (generalized)  Unsteadiness on feet  Rationale for Evaluation and Treatment Rehabilitation  SUBJECTIVE:                                                                                                                                                                                              SUBJECTIVE STATEMENT: Pt reports having scheduled an MD appointment on Friday (tomorrow). Pt was implored to inform MD regarding resting blood pressure values per his chart and his home measurements.   Pt accompanied by: self  PERTINENT HISTORY: Pt reports experiencing sudden onset of sharp pain in posterior neck, balance issues, and inability to walk. This led the pt to to ED after about an hour of symptom presentation and admitted for stroke work up on 09/13/21. 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 The pt received PT while in the hospital and was d/c'd on 09/16/21. Following hospital stay, pt reports dizziness/lightheadedness with quick positional changes. Pt reports continued deficits in balance, ability to walk, slight R sided lower third facial numbness, and vision changes. Was having difficulties with far sighted blurriness and double vision in R eye, received prescription for this and issue has resolved. The pt reports SOB when walking and feeling like he is unable to walk as far and "like I normally do". Denies B&B changes, night sweats, and  changes in memory and speech. Pt does report weight loss since hospital stay secondary to conscious choice of eating healthier. 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 PMH is significant for HTN, see chart for additional information.  PAIN:  Are you having pain? No  PRECAUTIONS: Fall  WEIGHT BEARING RESTRICTIONS No  FALLS: Has patient fallen in last 6 months? No  LIVING ENVIRONMENT: Lives with: lives with their family Lives in: House/apartment Stairs: Yes: External: 3 steps; can reach both Has following equipment at home: Quad cane small base, Walker - 4 wheeled, and shower chair  PLOF: Independent  PATIENT GOALS: "get my balance back, walking normal"  OBJECTIVE: (objective measures completed at initial evaluation unless otherwise dated)   LOWER EXTREMITY MMT:   Grossly 5/5  MMT Right Eval Left Eval  Hip flexion 5 5  Hip abduction 5 5  Hip adduction 5 5  Knee flexion 5 5  Knee extension 5 5  Ankle dorsiflexion 5 5  Ankle plantarflexion    (Blank rows = not tested)  -10RM squat from 12" box (symmetrical), 10RM knee flexion 27.5lb (=bilat)   TODAY'S TREATMENT:  BP: 159/107 mmHg Large Adult cuff used     X10 x 130# -moderate  2 X 10 x 160# - medium difficulty    -Kettle bell squat with  shoulder press 2 x 10 on ea side  - 25# bag toss for work simulation 2 x 10 each direction   - sledgehammer swing to large bag x 10 followed by 4 x 4 hurdles hopping over alternating each set with which LE leading over hurdles, 2 rounds of this circuit  -fatigue noted, improved coordination compared to several weeks prior with ladder drill.   Slider lunges x 10 lateral with ea LE and x 10 posterior slider lunge - for balance, coordination and strength training, cues for proper form.    Pt educated throughout session about proper posture and technique with exercises. Improved exercise technique, movement at target joints, use of target muscles after min to mod verbal, visual, tactile cues. Note: Portions of this document were prepared using Dragon voice recognition software and although reviewed may contain unintentional dictation errors in syntax, grammar, or spelling.  PATIENT EDUCATION: Education details: Pt educated throughout session regarding activity pacing, monitoring RLE fatigue for reduced fall risk, balance activities at home Person educated: Patient Education method: Programmer, multimedia, Facilities manager, Verbal cues, and Handouts Education comprehension: verbalized understanding and returned demonstration   HOME EXERCISE PROGRAM: Access Code: [email or web address] URL: [email or web address] Date: 10/13/2021 Prepared by: Temple Pacini  Exercises - Seated Long Arc Quad  - 1 x daily - 5 x weekly - 3 sets - 15 reps - Seated Hip Abduction with Resistance  - 1 x daily - 5 x weekly - 3 sets - 15 reps - Heel Raises with Counter Support  - 1 x daily - 5 x weekly - 3 sets - 15 reps - 2 hold - added SLS at home for a total of 5 minutes on each side; encouraged to balance until failure 11/04/21: -5 minutes moderate walkinjg 3-5x daily   GOALS: Goals reviewed with patient? No  SHORT TERM GOALS: Target date: 12/23/2021  Pt will be independent with HEP in order to improve strength, endurance,  and balance in order to decrease fall risk and improve function at home and work.  Baseline: 6/28: NEW Goal status: INITIAL   LONG TERM GOALS:  Target date: 02/03/2022  Pt will increase FOTO to at least 10 points in order to demonstrate improvement in mobility and QoL. Baseline: 66 Goal status: INITIAL  2.  Pt (< 60 yrs old) will decrease 5TSTS will complete 5xSTS test in < 10 seconds indicating an increase in LE strength and improved balance. Baseline: 6/28: 13.52 sec Goal status: INITIAL Consider future 30sec chair rise, more age appropriate   3.  Pt will increase distance to at least 1200 ft in order to demonstrate improvement in cardiopulmonary endurance, community ambulation, and gait ability.  Baseline: 6/28: 885' Goal status: INITIAL  4.  Pt will increase by at least 0.13 m/s in order to demonstrate clinically significant improvement in community ambulation.   Baseline: 6/28: 0.94 m/s Goal status: INITIAL   ASSESSMENT:  CLINICAL IMPRESSION: Continued with current plan of care as laid out in evaluation and recent prior sessions. Pt remains motivated to advance progress toward goals in order to maximize independence and safety at home. Pt requires high level assistance and cuing for completion of exercises in order to provide adequate level of stimulation and challenge. Pt progressed with reps and intensity of exercise today with good response. Pt will continue to benefit from skilled physical therapy intervention to address impairments, improve QOL, and attain therapy goals.     OBJECTIVE IMPAIRMENTS Abnormal gait, decreased activity tolerance, decreased balance, decreased coordination, decreased endurance, decreased knowledge of use of DME, decreased mobility, difficulty walking, decreased strength, dizziness, impaired sensation, impaired UE functional use, improper body mechanics, and postural dysfunction.   ACTIVITY LIMITATIONS carrying, lifting, bending,  squatting, stairs, transfers, dressing, reach over head, and locomotion level  PARTICIPATION LIMITATIONS: cleaning, laundry, interpersonal relationship, shopping, community activity, occupation, and yard work  PERSONAL FACTORS Time since onset of injury/illness/exacerbation and 1 comorbidity: HTN  are also affecting patient's functional outcome.   REHAB POTENTIAL: Good  CLINICAL DECISION MAKING: Stable/uncomplicated  EVALUATION COMPLEXITY: Low  PLAN: PT FREQUENCY: 2x/week  PT DURATION: 12 weeks  PLANNED INTERVENTIONS: Therapeutic exercises, Therapeutic activity, Neuromuscular re-education, Balance training, Gait training, Patient/Family education, Joint mobilization, Stair training, Vestibular training, DME instructions, Dry Needling, Electrical stimulation, Cryotherapy, Moist heat, Manual therapy, and Re-evaluation  PLAN FOR NEXT SESSION: balance, endurance, strengthening     10:20 AM, 11/11/21 Thresa Ross PT, DPT

## 2021-11-16 ENCOUNTER — Ambulatory Visit: Payer: 59 | Attending: Neurology

## 2021-11-16 DIAGNOSIS — R262 Difficulty in walking, not elsewhere classified: Secondary | ICD-10-CM | POA: Insufficient documentation

## 2021-11-16 DIAGNOSIS — M6281 Muscle weakness (generalized): Secondary | ICD-10-CM | POA: Diagnosis present

## 2021-11-16 DIAGNOSIS — R2681 Unsteadiness on feet: Secondary | ICD-10-CM | POA: Insufficient documentation

## 2021-11-16 NOTE — Therapy (Signed)
OUTPATIENT PHYSICAL THERAPY NEURO TREATMENT   Patient Name: Shawn [name] MRN: [id number] DOB:[birth year], 36 y.o., male Today's Date: 11/16/2021   PCP: N/A REFERRING PROVIDER: Lonell Face, MD   PT End of Session - 11/16/21 0811     Visit Number 7    Number of Visits 25    Date for PT Re-Evaluation 01/05/22    Authorization Type [employer]    Authorization Time Period No auth required    Progress Note Due on Visit 10    PT Start Time 0802    PT Stop Time 0845    PT Time Calculation (min) 43 min    Equipment Utilized During Treatment Gait belt    Activity Tolerance Treatment limited secondary to medical complications (Comment)    Behavior During Therapy WFL for tasks assessed/performed               Past Medical History:  Diagnosis Date   HTN (hypertension)    History reviewed. No pertinent surgical history. Patient Active Problem List   Diagnosis Date Noted   Essential hypertension 09/15/2021   Stroke-like symptom 09/13/2021    ONSET DATE: 09/13/21  REFERRING DIAG: Stroke [facility])  THERAPY DIAG:  Difficulty in walking, not elsewhere classified  Muscle weakness (generalized)  Unsteadiness on feet  Rationale for Evaluation and Treatment Rehabilitation  SUBJECTIVE:                                                                                                                                                                                              SUBJECTIVE STATEMENT: Pt reports having scheduled an MD appointment on Friday (tomorrow). Pt was implored to inform MD regarding resting blood pressure values per his chart and his home measurements.   Pt accompanied by: self  PERTINENT HISTORY: Pt reports experiencing sudden onset of sharp pain in posterior neck, balance issues, and inability to walk. This led the pt to to ED after about an hour of symptom presentation and admitted for stroke work up on 09/13/21. 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 The pt received PT while in the hospital and was d/c'd on 09/16/21. Following hospital stay, pt reports dizziness/lightheadedness with quick positional changes. Pt reports continued deficits in balance, ability to walk, slight R sided lower third facial numbness, and vision changes. Was having difficulties with far sighted blurriness and double vision in R eye, received prescription for this and issue has resolved. The pt reports SOB when walking and feeling like he is unable to walk as far and "like I normally do". Denies B&B changes, night sweats, and  changes in memory and speech. Pt does report weight loss since hospital stay secondary to conscious choice of eating healthier. 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 PMH is significant for HTN, see chart for additional information.  PAIN:  Are you having pain? No  PRECAUTIONS: Fall  WEIGHT BEARING RESTRICTIONS No  FALLS: Has patient fallen in last 6 months? No  LIVING ENVIRONMENT: Lives with: lives with their family Lives in: House/apartment Stairs: Yes: External: 3 steps; can reach both Has following equipment at home: Quad cane small base, Walker - 4 wheeled, and shower chair  PLOF: Independent  PATIENT GOALS: "get my balance back, walking normal"  OBJECTIVE: (objective measures completed at initial evaluation unless otherwise dated)   LOWER EXTREMITY MMT:   Grossly 5/5  MMT Right Eval Left Eval  Hip flexion 5 5  Hip abduction 5 5  Hip adduction 5 5  Knee flexion 5 5  Knee extension 5 5  Ankle dorsiflexion 5 5  Ankle plantarflexion    (Blank rows = not tested)  -10RM squat from 12" box (symmetrical), 10RM knee flexion 27.5lb (=bilat)   TODAY'S TREATMENT:  BP: 144/102 mmHg Large Adult cuff used  Dumbell Deadlift-  30# 2 sets x 12 reps (medium)   Step up onto bench + opp LE hip march x  10 reps each LE (patient rates medium)   Forward lunge squat with 30# dumbells- x12 reps  25lb sandbag toss (from shoulder height to floor) - x 10   2 min of lift 25 lb sand up and toss over to ground- pick it back up and repeat x 2 min   -Kettle bell squat with shoulder press 2 x 10 on ea side  -stand on airex pad and lunge forward onto BOSU ball and back to pad x 12 reps each LE  BP: 156/98 mmHg Large Adult cuff used Minimal rest breaks provided during today's session.   Pt educated throughout session about proper posture and technique with exercises. Improved exercise technique, movement at target joints, use of target muscles after min to mod verbal, visual, tactile cues. Note: Portions of this document were prepared using Dragon voice recognition software and although reviewed may contain unintentional dictation errors in syntax, grammar, or spelling.  PATIENT EDUCATION: Education details: Pt educated throughout session regarding activity pacing, monitoring RLE fatigue for reduced fall risk, balance activities at home Person educated: Patient Education method: Programmer, multimedia, Facilities manager, Verbal cues, and Handouts Education comprehension: verbalized understanding and returned demonstration   HOME EXERCISE PROGRAM: Access Code: [email or web address] URL: [email or web address] Date: 10/13/2021 Prepared by: Temple Pacini  Exercises - Seated Long Arc Quad  - 1 x daily - 5 x weekly - 3 sets - 15 reps - Seated Hip Abduction with Resistance  - 1 x daily - 5 x weekly - 3 sets - 15 reps - Heel Raises with Counter Support  - 1 x daily - 5 x weekly - 3 sets - 15 reps - 2 hold - added SLS at home for a total of 5 minutes on each side; encouraged to balance until failure 11/04/21: -5 minutes moderate walkinjg 3-5x daily   GOALS: Goals reviewed with patient? No  SHORT TERM GOALS: Target date: 12/28/2021  Pt will be independent with HEP in order to improve strength, endurance, and  balance in order to decrease fall risk and improve function at home and work.  Baseline: 6/28: NEW Goal  status: INITIAL   LONG TERM GOALS: Target date: 02/08/2022  Pt will increase FOTO to at least 10 points in order to demonstrate improvement in mobility and QoL. Baseline: 66 Goal status: INITIAL  2.  Pt (< 60 yrs old) will decrease 5TSTS will complete 5xSTS test in < 10 seconds indicating an increase in LE strength and improved balance. Baseline: 6/28: 13.52 sec Goal status: INITIAL Consider future 30sec chair rise, more age appropriate   3.  Pt will increase distance to at least 1200 ft in order to demonstrate improvement in cardiopulmonary endurance, community ambulation, and gait ability.  Baseline: 6/28: 885' Goal status: INITIAL  4.  Pt will increase by at least 0.13 m/s in order to demonstrate clinically significant improvement in community ambulation.   Baseline: 6/28: 0.94 m/s Goal status: INITIAL   ASSESSMENT:  CLINICAL IMPRESSION: Patient arrived with good motivation today - still presents with elevated BP (MD aware). He performed well- able to progress with both LE strength and balance. He was challenged with step up onto bench with hip march and later modified lunge squat on bosu ball but did improve overall with balance with increased reps and balance. He recovers quickly and able to lift/push/pull weight without LOB or significant difficulty. Pt will continue to benefit from skilled physical therapy intervention to address impairments, improve quality of life, and attain therapy goals.     OBJECTIVE IMPAIRMENTS Abnormal gait, decreased activity tolerance, decreased balance, decreased coordination, decreased endurance, decreased knowledge of use of DME, decreased mobility, difficulty walking, decreased strength, dizziness, impaired sensation, impaired UE functional use, improper body mechanics, and postural dysfunction.   ACTIVITY LIMITATIONS carrying,  lifting, bending, squatting, stairs, transfers, dressing, reach over head, and locomotion level  PARTICIPATION LIMITATIONS: cleaning, laundry, interpersonal relationship, shopping, community activity, occupation, and yard work  PERSONAL FACTORS Time since onset of injury/illness/exacerbation and 1 comorbidity: HTN  are also affecting patient's functional outcome.   REHAB POTENTIAL: Good  CLINICAL DECISION MAKING: Stable/uncomplicated  EVALUATION COMPLEXITY: Low  PLAN: PT FREQUENCY: 2x/week  PT DURATION: 12 weeks  PLANNED INTERVENTIONS: Therapeutic exercises, Therapeutic activity, Neuromuscular re-education, Balance training, Gait training, Patient/Family education, Joint mobilization, Stair training, Vestibular training, DME instructions, Dry Needling, Electrical stimulation, Cryotherapy, Moist heat, Manual therapy, and Re-evaluation  PLAN FOR NEXT SESSION: balance, endurance, strengthening     9:16 AM, 11/16/21 Thresa Ross PT, DPT

## 2021-11-18 ENCOUNTER — Ambulatory Visit: Payer: 59 | Admitting: Physical Therapy

## 2021-11-18 ENCOUNTER — Telehealth: Payer: Self-pay | Admitting: Physical Therapy

## 2021-11-18 NOTE — Telephone Encounter (Signed)
Pt contacted regarding missed appointment. Pt reports he slept in past appointment time.  Pt informed of next appointment date and time and reports he will be in attendance for this appointment.   Thresa Ross PT, DPT

## 2021-11-22 NOTE — Therapy (Unsigned)
OUTPATIENT PHYSICAL THERAPY NEURO TREATMENT   Patient Name: Shawn [name] MRN: [id number] DOB:[birth year], 36 y.o., male Today's Date: 11/23/2021   PCP: N/A REFERRING PROVIDER: Lonell Face, MD   PT End of Session - 11/23/21 0806     Visit Number 8    Number of Visits 25    Date for PT Re-Evaluation 01/05/22    Authorization Type [employer]    Authorization Time Period No auth required, see re-eval date for re cert date    Progress Note Due on Visit 10    PT Start Time 0802    PT Stop Time 0845    PT Time Calculation (min) 43 min    Equipment Utilized During Treatment Gait belt    Activity Tolerance Treatment limited secondary to medical complications (Comment)    Behavior During Therapy WFL for tasks assessed/performed                Past Medical History:  Diagnosis Date   HTN (hypertension)    No past surgical history on file. Patient Active Problem List   Diagnosis Date Noted   Essential hypertension 09/15/2021   Stroke-like symptom 09/13/2021    ONSET DATE: 09/13/21  REFERRING DIAG: Stroke ([facility])  THERAPY DIAG:  Difficulty in walking, not elsewhere classified  Muscle weakness (generalized)  Unsteadiness on feet  Rationale for Evaluation and Treatment Rehabilitation  SUBJECTIVE:                                                                                                                                                                                              SUBJECTIVE STATEMENT: Pt reports continued frustrations with MD regarding signing disability paperwork. PT reports MD has been called and office informed of his current condition and progress.   Pt accompanied by: self  PERTINENT HISTORY: Pt reports experiencing sudden onset of sharp pain in posterior neck, balance issues, and inability to walk. This led the pt to to ED after about an hour of symptom presentation and admitted for stroke work up on 09/13/21. Found to have early subacute  ischemic infarct involving R dorsolateral medulla. The pt received PT while in the hospital and was d/c'd on 09/16/21. Following hospital stay, pt reports dizziness/lightheadedness with quick positional changes. Pt reports continued deficits in balance, ability to walk, slight R sided lower third facial numbness, and vision changes. Was having difficulties with far sighted blurriness and double vision in R eye, received prescription for this and issue has resolved. The pt reports SOB when walking and feeling like he is unable to walk as far and "like I normally do". Denies  B&B changes, night sweats, and changes in memory and speech. Pt does report weight loss since hospital stay secondary to conscious choice of eating healthier. 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 PMH is significant for HTN, see chart for additional information.  PAIN:  Are you having pain? No  PRECAUTIONS: Fall  WEIGHT BEARING RESTRICTIONS No  FALLS: Has patient fallen in last 6 months? No  LIVING ENVIRONMENT: Lives with: lives with their family Lives in: House/apartment Stairs: Yes: External: 3 steps; can reach both Has following equipment at home: Quad cane small base, Walker - 4 wheeled, and shower chair  PLOF: Independent  PATIENT GOALS: "get my balance back, walking normal"  OBJECTIVE: (objective measures completed at initial evaluation unless otherwise dated)   LOWER EXTREMITY MMT:   Grossly 5/5  MMT Right Eval Left Eval  Hip flexion 5 5  Hip abduction 5 5  Hip adduction 5 5  Knee flexion 5 5  Knee extension 5 5  Ankle dorsiflexion 5 5  Ankle plantarflexion    (Blank rows = not tested)  -10RM squat from 12" box (symmetrical), 10RM knee flexion 27.5lb (=bilat)   TODAY'S TREATMENT:  NMR   Kore balance balance training :2 x scales game to work on even weight distribution. Succeeded on both, improved speed  with completion on the second round. No LOB noted   Kore Maze game: first round required U UE assist for maintenance of balance, challenged to not use UE on second round able to complete without Ue Assist but increased time.  - seocnd maze: near LOB with post weight shift but recovered with hip strategy, completes maze successfully without UE assist  Maze 3: Completes without LOB   Tux racer: 3 x bunny slope, difficulty maintaining balance without error.  All the above working on neuromuscular reintegration. And dynamic balance ans stability training.   Balance course: airex, foam pad, incline board and rocker board: x 3, easy -added bosu and dynadisc and increased challenge significantly, performed 5 rounds, competed 4/10 laps without UE assist. Pt rated challenging.   Minimal rest breaks provided during today's session.   Pt educated throughout session about proper posture and technique with exercises. Improved exercise technique, movement at target joints, use of target muscles after min to mod verbal, visual, tactile cues.  Note: Portions of this document were prepared using Dragon voice recognition software and although reviewed may contain unintentional dictation errors in syntax, grammar, or spelling.  PATIENT EDUCATION: Education details: Pt educated throughout session regarding activity pacing, monitoring RLE fatigue for reduced fall risk, balance activities at home Person educated: Patient Education method: Programmer, multimedia, Facilities manager, Verbal cues, and Handouts Education comprehension: verbalized understanding and returned demonstration   HOME EXERCISE PROGRAM: Access Code: [email or web address] URL: [email or web address] Date: 10/13/2021 Prepared by: Temple Pacini  Exercises - Seated Long Arc Quad  - 1 x daily - 5 x weekly - 3 sets - 15 reps - Seated Hip Abduction with Resistance  - 1 x daily - 5 x weekly - 3 sets - 15 reps - Heel Raises with Counter Support  - 1 x daily -  5 x weekly - 3 sets - 15 reps - 2 hold - added SLS at home for a total of 5 minutes on each side; encouraged to balance until failure 11/04/21: -5 minutes moderate walkinng 3-5x daily   GOALS: Goals reviewed with patient? No  SHORT TERM GOALS: Target  date: 01/04/2022  Pt will be independent with HEP in order to improve strength, endurance, and balance in order to decrease fall risk and improve function at home and work.  Baseline: 6/28: NEW Goal status: INITIAL   LONG TERM GOALS: Target date: 02/15/2022  Pt will increase FOTO to at least 10 points in order to demonstrate improvement in mobility and QoL. Baseline: 66 Goal status: INITIAL  2.  Pt (< 60 yrs old) will decrease 5TSTS will complete 5xSTS test in < 10 seconds indicating an increase in LE strength and improved balance. Baseline: 6/28: 13.52 sec Goal status: INITIAL Consider future 30sec chair rise, more age appropriate   3.  Pt will increase distance to at least 1200 ft in order to demonstrate improvement in cardiopulmonary endurance, community ambulation, and gait ability.  Baseline: 6/28: 885' Goal status: INITIAL  4.  Pt will increase by at least 0.13 m/s in order to demonstrate clinically significant improvement in community ambulation.   Baseline: 6/28: 0.94 m/s Goal status: INITIAL   ASSESSMENT:  CLINICAL IMPRESSION: Patient arrived with good motivation today - still presents with elevated BP (MD aware). Continued with current plan of care as laid out in evaluation and recent prior sessions. Pt remains motivated to advance progress toward goals in order to maximize independence and safety at home. Pt requires high level assistance and cuing for completion of exercises in order to provide adequate level of stimulation and perturbation. Pt closely monitored throughout session for safe vitals response and to maximize patient safety during interventions. Pt progressed with balance on neurocom as well as with  high level balance obstacle course with good efficacy but with difficulty transitioning to unsteady surfaces, particularly with single leg balance on unsteady surfaces. Pt continues to demonstrate progress toward goals AEB progression of some interventions this date either in volume or intensity.     OBJECTIVE IMPAIRMENTS Abnormal gait, decreased activity tolerance, decreased balance, decreased coordination, decreased endurance, decreased knowledge of use of DME, decreased mobility, difficulty walking, decreased strength, dizziness, impaired sensation, impaired UE functional use, improper body mechanics, and postural dysfunction.   ACTIVITY LIMITATIONS carrying, lifting, bending, squatting, stairs, transfers, dressing, reach over head, and locomotion level  PARTICIPATION LIMITATIONS: cleaning, laundry, interpersonal relationship, shopping, community activity, occupation, and yard work  PERSONAL FACTORS Time since onset of injury/illness/exacerbation and 1 comorbidity: HTN  are also affecting patient's functional outcome.   REHAB POTENTIAL: Good  CLINICAL DECISION MAKING: Stable/uncomplicated  EVALUATION COMPLEXITY: Low  PLAN: PT FREQUENCY: 2x/week  PT DURATION: 12 weeks  PLANNED INTERVENTIONS: Therapeutic exercises, Therapeutic activity, Neuromuscular re-education, Balance training, Gait training, Patient/Family education, Joint mobilization, Stair training, Vestibular training, DME instructions, Dry Needling, Electrical stimulation, Cryotherapy, Moist heat, Manual therapy, and Re-evaluation  PLAN FOR NEXT SESSION: balance, endurance, strengthening     8:07 AM, 11/23/21 Norman Herrlich PT

## 2021-11-23 ENCOUNTER — Ambulatory Visit: Payer: 59 | Admitting: Physical Therapy

## 2021-11-23 VITALS — BP 140/102

## 2021-11-23 DIAGNOSIS — M6281 Muscle weakness (generalized): Secondary | ICD-10-CM

## 2021-11-23 DIAGNOSIS — R2681 Unsteadiness on feet: Secondary | ICD-10-CM

## 2021-11-23 DIAGNOSIS — R262 Difficulty in walking, not elsewhere classified: Secondary | ICD-10-CM | POA: Diagnosis not present

## 2021-11-25 ENCOUNTER — Ambulatory Visit: Payer: 59 | Admitting: Physical Therapy

## 2021-11-25 VITALS — BP 148/103 | HR 74

## 2021-11-25 DIAGNOSIS — R262 Difficulty in walking, not elsewhere classified: Secondary | ICD-10-CM | POA: Diagnosis not present

## 2021-11-25 DIAGNOSIS — R2681 Unsteadiness on feet: Secondary | ICD-10-CM

## 2021-11-25 DIAGNOSIS — M6281 Muscle weakness (generalized): Secondary | ICD-10-CM

## 2021-11-25 NOTE — Therapy (Signed)
OUTPATIENT PHYSICAL THERAPY NEURO TREATMENT   Patient Name: Shawn [name] MRN: [id number] DOB:[birth year], 36 y.o., male Today's Date: 11/25/2021   PCP: N/A REFERRING PROVIDER: Lonell Face, MD   PT End of Session - 11/25/21 0806     Visit Number 9    Number of Visits 25    Date for PT Re-Evaluation 01/05/22    Authorization Type [employer]    Authorization Time Period No auth required, see re-eval date for re cert date    Progress Note Due on Visit 10    PT Start Time 0803    PT Stop Time 0845    PT Time Calculation (min) 42 min    Equipment Utilized During Treatment Gait belt    Activity Tolerance Treatment limited secondary to medical complications (Comment)    Behavior During Therapy WFL for tasks assessed/performed                Past Medical History:  Diagnosis Date   HTN (hypertension)    No past surgical history on file. Patient Active Problem List   Diagnosis Date Noted   Essential hypertension 09/15/2021   Stroke-like symptom 09/13/2021    ONSET DATE: 09/13/21  REFERRING DIAG: Stroke (HCC)  THERAPY DIAG:  Difficulty in walking, not elsewhere classified  Muscle weakness (generalized)  Unsteadiness on feet  Rationale for Evaluation and Treatment Rehabilitation  SUBJECTIVE:                                                                                                                                                                                              SUBJECTIVE STATEMENT: Pt reports continued frustrations with MD regarding signing disability paperwork. PT reports MD has been called and office informed of his current condition and progress.   Pt accompanied by: self  PERTINENT HISTORY: Pt reports experiencing sudden onset of sharp pain in posterior neck, balance issues, and inability to walk. This led the pt to to ED after about an hour of symptom presentation and admitted for stroke work up on 09/13/21. 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 The pt received PT while in the hospital and was d/c'd on 09/16/21. Following hospital stay, pt reports dizziness/lightheadedness with quick positional changes. Pt reports continued deficits in balance, ability to walk, slight R sided lower third facial numbness, and vision changes. Was having difficulties with far sighted blurriness and double vision in R eye, received prescription for this and issue has resolved. The pt reports SOB when walking and feeling like he is unable to walk as far and "like I normally do". Denies  B&B changes, night sweats, and changes in memory and speech. Pt does report weight loss since hospital stay secondary to conscious choice of eating healthier. 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 PMH is significant for HTN, see chart for additional information.  PAIN:  Are you having pain? No  PRECAUTIONS: Fall  WEIGHT BEARING RESTRICTIONS No  FALLS: Has patient fallen in last 6 months? No  LIVING ENVIRONMENT: Lives with: lives with their family Lives in: House/apartment Stairs: Yes: External: 3 steps; can reach both Has following equipment at home: Quad cane small base, Walker - 4 wheeled, and shower chair  PLOF: Independent  PATIENT GOALS: "get my balance back, walking normal"  OBJECTIVE: (objective measures completed at initial evaluation unless otherwise dated)   LOWER EXTREMITY MMT:   Grossly 5/5  MMT Right Eval Left Eval  Hip flexion 5 5  Hip abduction 5 5  Hip adduction 5 5  Knee flexion 5 5  Knee extension 5 5  Ankle dorsiflexion 5 5  Ankle plantarflexion    (Blank rows = not tested)  -10RM squat from 12" box (symmetrical), 10RM knee flexion 27.5lb (=bilat)  BP pre session 148/103 HR 74 TODAY'S TREATMENT:  NMR   [place]ore balance balance training  Tux racer: 3 x difficult sope with cliffs,  difficulty maintaining balance without error and unable to consistently controll speed with mutiple errors.   Pop dot x 3 rounds, no LOB but difficulty reaching end range  Round 1 9 dots, round 2 15 dots, round 3: 19   All the above working on neuromuscular reintegration. And dynamic balance ans stability training.    Balance course w/ bosu, dynadisc, airex SLS x 3 sec, airex balance beam and walking longitudinally on 1/2 form roller round side up. X 5 laps with challenge to complete 2 " perfect laps". Difficulty with all tasks particularly with NBOS or SLS.   Basketball sot with side stee to and from BOSU balls 2 rounds of 14 shots, cues for consistent side stepping.   BP post session: 138/98  Minimal rest breaks provided during today's session.   Pt educated throughout session about proper posture and technique with exercises. Improved exercise technique, movement at target joints, use of target muscles after min to mod verbal, visual, tactile cues.  Note: Portions of this document were prepared using Dragon voice recognition software and although reviewed may contain unintentional dictation errors in syntax, grammar, or spelling.  PATIENT EDUCATION: Education details: Pt educated throughout session regarding activity pacing, monitoring RLE fatigue for reduced fall risk, balance activities at home Person educated: Patient Education method: Programmer, multimedia, Facilities manager, Verbal cues, and Handouts Education comprehension: verbalized understanding and returned demonstration   HOME EXERCISE PROGRAM: Access Code: [email or web address] URL: [email or web address] Date: 10/13/2021 Prepared by: Temple Pacini  Exercises - Seated Long Arc Quad  - 1 x daily - 5 x weekly - 3 sets - 15 reps - Seated Hip Abduction with Resistance  - 1 x daily - 5 x weekly - 3 sets - 15 reps - Heel Raises with Counter Support  - 1 x daily - 5 x weekly - 3 sets - 15 reps - 2 hold - added SLS at home for a total of  5 minutes on each side; encouraged to balance until failure 11/04/21: -5 minutes moderate walkinng 3-5x daily   GOALS: Goals reviewed with patient? No  SHORT TERM GOALS: Target date: 01/06/2022  Pt will be independent with HEP in order to improve strength, endurance, and balance in order to decrease fall risk and improve function at home and work.  Baseline: 6/28: NEW Goal status: INITIAL   LONG TERM GOALS: Target date: 02/17/2022  Pt will increase FOTO to at least 10 points in order to demonstrate improvement in mobility and QoL. Baseline: 66 Goal status: INITIAL  2.  Pt (< 60 yrs old) will decrease 5TSTS will complete 5xSTS test in < 10 seconds indicating an increase in LE strength and improved balance. Baseline: 6/28: 13.52 sec Goal status: INITIAL Consider future 30sec chair rise, more age appropriate   3.  Pt will increase distance to at least 1200 ft in order to demonstrate improvement in cardiopulmonary endurance, community ambulation, and gait ability.  Baseline: 6/28: 885' Goal status: INITIAL  4.  Pt will increase by at least 0.13 m/s in order to demonstrate clinically significant improvement in community ambulation.   Baseline: 6/28: 0.94 m/s Goal status: INITIAL   ASSESSMENT:  CLINICAL IMPRESSION: Patient arrived with good motivation today - still presents with elevated BP (MD aware). Continued with current plan of care as laid out in evaluation and recent prior sessions. Pt remains motivated to advance progress toward goals in order to maximize independence and safety at home. Pt requires high level assistance and cuing for completion of exercises in order to provide adequate level of stimulation and perturbation. Pt closely monitored throughout session for safe vitals response and to maximize patient safety during interventions. Pt progressed with balance on neurocom as well as with high level balance obstacle course with good efficacy but with difficulty  transitioning to unsteady surfaces, particularly with single leg balance on unsteady surfaces. Pt continues to demonstrate progress toward goals AEB progression of some interventions this date either in volume or intensity.     OBJECTIVE IMPAIRMENTS Abnormal gait, decreased activity tolerance, decreased balance, decreased coordination, decreased endurance, decreased knowledge of use of DME, decreased mobility, difficulty walking, decreased strength, dizziness, impaired sensation, impaired UE functional use, improper body mechanics, and postural dysfunction.   ACTIVITY LIMITATIONS carrying, lifting, bending, squatting, stairs, transfers, dressing, reach over head, and locomotion level  PARTICIPATION LIMITATIONS: cleaning, laundry, interpersonal relationship, shopping, community activity, occupation, and yard work  PERSONAL FACTORS Time since onset of injury/illness/exacerbation and 1 comorbidity: HTN  are also affecting patient's functional outcome.   REHAB POTENTIAL: Good  CLINICAL DECISION MAKING: Stable/uncomplicated  EVALUATION COMPLEXITY: Low  PLAN: PT FREQUENCY: 2x/week  PT DURATION: 12 weeks  PLANNED INTERVENTIONS: Therapeutic exercises, Therapeutic activity, Neuromuscular re-education, Balance training, Gait training, Patient/Family education, Joint mobilization, Stair training, Vestibular training, DME instructions, Dry Needling, Electrical stimulation, Cryotherapy, Moist heat, Manual therapy, and Re-evaluation  PLAN FOR NEXT SESSION: balance, endurance, strengthening     9:29 AM, 11/25/21 Norman Herrlich PT

## 2021-11-30 ENCOUNTER — Ambulatory Visit: Payer: 59 | Admitting: Physical Therapy

## 2021-12-02 ENCOUNTER — Ambulatory Visit: Payer: 59 | Admitting: Physical Therapy

## 2021-12-02 VITALS — BP 148/104 | HR 69

## 2021-12-02 DIAGNOSIS — R2681 Unsteadiness on feet: Secondary | ICD-10-CM

## 2021-12-02 DIAGNOSIS — M6281 Muscle weakness (generalized): Secondary | ICD-10-CM

## 2021-12-02 DIAGNOSIS — R262 Difficulty in walking, not elsewhere classified: Secondary | ICD-10-CM | POA: Diagnosis not present

## 2021-12-02 NOTE — Therapy (Signed)
OUTPATIENT PHYSICAL THERAPY NEURO TREATMENT/ Physical Therapy Progress Note/ Discharge Therapy    Dates of reporting period  10/13/21   to   12/02/21    Patient Name: Shawn [name] MRN: [id number] DOB:[birth year], 36 y.o., male Today's Date: 12/02/2021   PCP: N/A REFERRING PROVIDER: Vladimir Crofts, MD   PT End of Session - 12/02/21 0800     Visit Number 10    Number of Visits 25    Date for PT Re-Evaluation 01/05/22    Authorization Type [facility]    Authorization Time Period No auth required, see re-eval date for re cert date    Progress Note Due on Visit 10    PT Start Time 0803    PT Stop Time 0844    PT Time Calculation (min) 41 min    Equipment Utilized During Treatment Gait belt    Activity Tolerance Treatment limited secondary to medical complications (Comment)    Behavior During Therapy [facility] for tasks assessed/performed                 Past Medical History:  Diagnosis Date   HTN (hypertension)    No past surgical history on file. Patient Active Problem List   Diagnosis Date Noted   Essential hypertension 09/15/2021   Stroke-like symptom 09/13/2021    ONSET DATE: 09/13/21  REFERRING DIAG: Stroke ([place])  THERAPY DIAG:  Difficulty in walking, not elsewhere classified  Muscle weakness (generalized)  Unsteadiness on feet  Rationale for Evaluation and Treatment Rehabilitation  SUBJECTIVE:                                                                                                                                                                                              SUBJECTIVE STATEMENT: Pt reports continued frustrations with MD regarding signing disability paperwork. PT reports MD has been called and office informed of his current condition and progress.   Pt accompanied by: self  PERTINENT HISTORY: Pt reports experiencing sudden onset of sharp pain in posterior neck, balance issues, and inability to walk. This led the pt to to ED  after about an hour of symptom presentation and admitted for stroke work up on 09/13/21. 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 The pt received PT while in the hospital and was d/c'd on 09/16/21. Following hospital stay, pt reports dizziness/lightheadedness with quick positional changes. Pt reports continued deficits in balance, ability to walk, slight R sided lower third facial numbness, and vision changes. Was having difficulties with far sighted blurriness and double vision in R eye, received prescription for this and issue has  resolved. The pt reports SOB when walking and feeling like he is unable to walk as far and "like I normally do". Denies B&B changes, night sweats, and changes in memory and speech. Pt does report weight loss since hospital stay secondary to conscious choice of eating healthier. 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 PMH is significant for HTN, see chart for additional information.  PAIN:  Are you having pain? No  PRECAUTIONS: Fall  WEIGHT BEARING RESTRICTIONS No  FALLS: Has patient fallen in last 6 months? No  LIVING ENVIRONMENT: Lives with: lives with their family Lives in: House/apartment Stairs: Yes: External: 3 steps; can reach both Has following equipment at home: Quad cane small base, Walker - 4 wheeled, and shower chair  PLOF: Independent  PATIENT GOALS: "get my balance back, walking normal"  OBJECTIVE: (objective measures completed at initial evaluation unless otherwise dated)   LOWER EXTREMITY MMT:   Grossly 5/5  MMT Right Eval Left Eval  Hip flexion 5 5  Hip abduction 5 5  Hip adduction 5 5  Knee flexion 5 5  Knee extension 5 5  Ankle dorsiflexion 5 5  Ankle plantarflexion    (Blank rows = not tested)  -10RM squat from 12" box (symmetrical), 10RM knee flexion 27.5lb (=bilat)  BP pre session 148/104 HR 69 BP  post 6MWT: 147/99 HR 68 TODAY'S TREATMENT: Physical therapy treatment session today consisted of completing assessment of goals and administration of testing as demonstrated in flow sheet. Addition treatments may be found below.    6MWT: 12/02/21 1310 feet  FOTO 67 10MWT 1.32 m/s  5X STS 8.59 sec   30 second chair stand 12/02/21:  23 sit to stands in 30 seconds (mean 24 with standard deviation of 6.3 for his age group) indicating normative LE strength and power ofr his age group    [facility] PT Assessment - 12/02/21 0001       Standardized Balance Assessment   Standardized Balance Assessment Mini-BESTest      Mini-BESTest   Sit To Stand Normal: Comes to stand without use of hands and stabilizes independently.    Rise to Toes Normal: Stable for 3 s with maximum height.    Stand on one leg (left) Normal: 20 s.    Stand on one leg (right) Normal: 20 s.    Stand on one leg - lowest score 2    Compensatory Stepping Correction - Forward Normal: Recovers independently with a single, large step (second realignement is allowed).    Compensatory Stepping Correction - Backward Normal: Recovers independently with a single, large step    Compensatory Stepping Correction - Left Lateral Normal: Recovers independently with 1 step (crossover or lateral OK)    Compensatory Stepping Correction - Right Lateral Normal: Recovers independently with 1 step (crossover or lateral OK)    Stepping Corredtion Lateral - lowest score 2    Stance - Feet together, eyes open, firm surface  Normal: 30s    Stance - Feet together, eyes closed, foam surface  Normal: 30s    Incline - Eyes Closed Normal: Stands independently 30s and aligns with gravity    Change in Gait Speed Normal: Significantly changes walkling speed without imbalance    Walk with head turns - Horizontal Moderate: performs head turns with reduction in gait speed.   deviates from forward line of progresiomn by 1-2 feet   Walk with pivot turns Normal:  Turns with  feet close FAST (< 3 steps) with good balance.    Step over obstacles Normal: Able to step over box with minimal change of gait speed and with good balance.    Timed UP & GO with Dual Task Normal: No noticeable change in sitting, standing or walking while backward counting when compared to TUG without   8.64   Mini-BEST total score 27           No significant impairments in balance based on mini-BEST score      Minimal rest breaks provided during today's session.   Pt educated throughout session about proper posture and technique with exercises. Improved exercise technique, movement at target joints, use of target muscles after min to mod verbal, visual, tactile cues.  Note: Portions of this document were prepared using Dragon voice recognition software and although reviewed may contain unintentional dictation errors in syntax, grammar, or spelling.  PATIENT EDUCATION: Education details: Pt educated throughout session regarding activity pacing, monitoring RLE fatigue for reduced fall risk, balance activities at home Person educated: Patient Education method: Consulting civil engineer, Media planner, Verbal cues, and Handouts Education comprehension: verbalized understanding and returned demonstration   HOME EXERCISE PROGRAM: Access Code: [email or web address] URL: [email or web address] Date: 10/13/2021 Prepared by: Ricard Dillon  Exercises - Seated Long Arc Quad  - 1 x daily - 5 x weekly - 3 sets - 15 reps - Seated Hip Abduction with Resistance  - 1 x daily - 5 x weekly - 3 sets - 15 reps - Heel Raises with Counter Support  - 1 x daily - 5 x weekly - 3 sets - 15 reps - 2 hold - added SLS at home for a total of 5 minutes on each side; encouraged to balance until failure 11/04/21: -5 minutes moderate walkinng 3-5x daily   GOALS: Goals reviewed with patient? No  SHORT TERM GOALS: Target date: 01/13/2022  Pt will be independent with HEP in order to improve strength, endurance, and balance  in order to decrease fall risk and improve function at home and work.  Baseline: 6/28: NEW Goal status: INITIAL   LONG TERM GOALS: Target date: 02/24/2022  Pt will increase FOTO to at least 10 points in order to demonstrate improvement in mobility and QoL. Baseline: 34 (revised to reflect FOTO survey initial score in [place] system)  8/17: 66.9 Goal status: MET  2.  Pt (< 8 yrs old) will decrease 5TSTS will complete 5xSTS test in < 10 seconds indicating an increase in LE strength and improved balance. Baseline: 6/28: 13.52 sec 8/17: 8.59 sec  Goal status: MET Consider future 30sec chair rise, more age appropriate completed 12/02/21:  23 sit to stands in 30 seconds ( mean 24 with standard deviation of 6.3 for his age group) indicating normative LE strength and power ofr his age group   3.  Pt will increase 6MWT distance to at least 1200 ft in order to demonstrate improvement in cardiopulmonary endurance, community ambulation, and gait ability.  Baseline: 6/28: 885' 8/17: 1310 feet  Goal status: MET  4.  Pt will increase 10MWT by at least 0.13 m/s in order to demonstrate clinically significant improvement in community ambulation.   Baseline: 6/28: 0.94 m/s8/17:1.32 m/s Goal status: MET 5.  ASSESSMENT:  CLINICAL IMPRESSION: Patient presents to physical therapy for progress note this date.  All initial goals assessed and patient has met and surpassed all of these goals.  Patient also assessed with mini best balance test and scores near perfect score on  this indicating very minimal balance impairments.  Patient demonstrates good anticipatory balance, reactive posture control, dynamic gait, and dual task balance this date.  Patient also reports he feels his balance and strength and endurance no longer impacting his everyday life but he is still concerned with his high blood pressure which she has been consulting with his physician about.  Physical therapist is also voiced concerns with elevated  blood pressure to physician for further work-up and follow-up.  Due to patient meeting all goals and demonstrating good balance with testing performed patient will be discharged from formal physical therapy at this time and will follow up with his doctor regarding further steps for management of his blood pressure for safe return to work.  All questions answered regarding physical limitations at this time.     OBJECTIVE IMPAIRMENTS Abnormal gait, decreased activity tolerance, decreased balance, decreased coordination, decreased endurance, decreased knowledge of use of DME, decreased mobility, difficulty walking, decreased strength, dizziness, impaired sensation, impaired UE functional use, improper body mechanics, and postural dysfunction.   ACTIVITY LIMITATIONS carrying, lifting, bending, squatting, stairs, transfers, dressing, reach over head, and locomotion level  PARTICIPATION LIMITATIONS: cleaning, laundry, interpersonal relationship, shopping, community activity, occupation, and yard work  PERSONAL FACTORS Time since onset of injury/illness/exacerbation and 1 comorbidity: HTN  are also affecting patient's functional outcome.   REHAB POTENTIAL: Good  CLINICAL DECISION MAKING: Stable/uncomplicated  EVALUATION COMPLEXITY: Low  PLAN: PT FREQUENCY: 2x/week  PT DURATION: 12 weeks  PLANNED INTERVENTIONS: Therapeutic exercises, Therapeutic activity, Neuromuscular re-education, Balance training, Gait training, Patient/Family education, Joint mobilization, Stair training, Vestibular training, DME instructions, Dry Needling, Electrical stimulation, Cryotherapy, Moist heat, Manual therapy, and Re-evaluation  PLAN FOR NEXT SESSION: Patient to be discharged from formal physical therapy at this time.    8:58 AM, 12/02/21 Particia Lather PT

## 2021-12-07 ENCOUNTER — Ambulatory Visit: Payer: 59 | Admitting: Physical Therapy

## 2021-12-09 ENCOUNTER — Ambulatory Visit: Payer: 59 | Admitting: Physical Therapy

## 2021-12-14 ENCOUNTER — Ambulatory Visit: Payer: 59 | Admitting: Physical Therapy

## 2021-12-16 ENCOUNTER — Ambulatory Visit: Payer: 59 | Admitting: Physical Therapy

## 2021-12-31 ENCOUNTER — Ambulatory Visit: Payer: 59 | Admitting: Physical Therapy

## 2022-01-05 ENCOUNTER — Ambulatory Visit: Payer: 59 | Admitting: Physical Therapy

## 2022-01-07 ENCOUNTER — Ambulatory Visit: Payer: 59 | Admitting: Physical Therapy

## 2022-01-12 ENCOUNTER — Ambulatory Visit: Payer: 59 | Admitting: Physical Therapy

## 2022-01-14 ENCOUNTER — Ambulatory Visit: Payer: 59 | Admitting: Physical Therapy

## 2022-01-19 ENCOUNTER — Ambulatory Visit: Payer: 59 | Admitting: Physical Therapy

## 2022-01-21 ENCOUNTER — Ambulatory Visit: Payer: 59 | Admitting: Physical Therapy

## 2022-01-26 ENCOUNTER — Ambulatory Visit: Payer: 59 | Admitting: Physical Therapy

## 2022-01-28 ENCOUNTER — Ambulatory Visit: Payer: 59 | Admitting: Physical Therapy

## 2022-02-02 ENCOUNTER — Ambulatory Visit: Payer: 59 | Admitting: Physical Therapy

## 2022-02-04 ENCOUNTER — Ambulatory Visit: Payer: 59 | Admitting: Physical Therapy

## 2022-02-09 ENCOUNTER — Ambulatory Visit: Payer: 59 | Admitting: Physical Therapy

## 2022-02-11 ENCOUNTER — Ambulatory Visit: Payer: 59 | Admitting: Physical Therapy

## 2022-02-16 ENCOUNTER — Ambulatory Visit: Payer: 59 | Admitting: Physical Therapy

## 2022-02-18 ENCOUNTER — Ambulatory Visit: Payer: 59 | Admitting: Physical Therapy

## 2022-02-23 ENCOUNTER — Ambulatory Visit: Payer: 59 | Admitting: Physical Therapy

## 2022-02-25 ENCOUNTER — Ambulatory Visit: Payer: 59 | Admitting: Physical Therapy

## 2022-03-02 ENCOUNTER — Ambulatory Visit: Payer: 59 | Admitting: Physical Therapy

## 2022-03-04 ENCOUNTER — Ambulatory Visit: Payer: 59 | Admitting: Physical Therapy

## 2022-03-09 ENCOUNTER — Ambulatory Visit: Payer: 59

## 2022-03-16 ENCOUNTER — Ambulatory Visit: Payer: 59 | Admitting: Physical Therapy

## 2022-03-18 ENCOUNTER — Ambulatory Visit: Payer: 59 | Admitting: Physical Therapy

## 2022-03-23 ENCOUNTER — Ambulatory Visit: Payer: 59 | Admitting: Physical Therapy

## 2022-03-25 ENCOUNTER — Ambulatory Visit: Payer: 59 | Admitting: Physical Therapy

## 2022-03-30 ENCOUNTER — Ambulatory Visit: Payer: 59 | Admitting: Physical Therapy

## 2022-04-01 ENCOUNTER — Ambulatory Visit: Payer: 59 | Admitting: Physical Therapy

## 2022-04-06 ENCOUNTER — Ambulatory Visit: Payer: 59 | Admitting: Physical Therapy

## 2022-04-08 ENCOUNTER — Ambulatory Visit: Payer: 59 | Admitting: Physical Therapy

## 2022-04-13 ENCOUNTER — Ambulatory Visit: Payer: 59 | Admitting: Physical Therapy

## 2022-04-15 ENCOUNTER — Ambulatory Visit: Payer: 59 | Admitting: Physical Therapy

## 2022-04-20 ENCOUNTER — Ambulatory Visit: Payer: 59 | Admitting: Physical Therapy

## 2022-04-22 ENCOUNTER — Ambulatory Visit: Payer: 59 | Admitting: Physical Therapy
